# Patient Record
Sex: Female | Born: 1960 | Race: Black or African American | Hispanic: No | Marital: Single | Smoking: Never smoker
Health system: Southern US, Community
[De-identification: ages and names within clinical notes are randomized; demographics above are authoritative.]

## PROBLEM LIST (undated history)

## (undated) DIAGNOSIS — I10 Essential (primary) hypertension: Secondary | ICD-10-CM

## (undated) HISTORY — PX: DILATION AND CURETTAGE OF UTERUS: SHX78

---

## 1998-02-23 ENCOUNTER — Emergency Department (HOSPITAL_COMMUNITY): Admission: EM | Admit: 1998-02-23 | Discharge: 1998-02-23 | Payer: Self-pay | Admitting: Emergency Medicine

## 1998-10-30 ENCOUNTER — Encounter: Payer: Self-pay | Admitting: Orthopedic Surgery

## 1998-10-30 ENCOUNTER — Ambulatory Visit (HOSPITAL_COMMUNITY): Admission: RE | Admit: 1998-10-30 | Discharge: 1998-10-30 | Payer: Self-pay | Admitting: Orthopedic Surgery

## 2001-04-26 ENCOUNTER — Encounter (INDEPENDENT_AMBULATORY_CARE_PROVIDER_SITE_OTHER): Payer: Self-pay | Admitting: Family Medicine

## 2001-04-26 LAB — CONVERTED CEMR LAB: Pap Smear: NORMAL

## 2003-12-19 ENCOUNTER — Emergency Department (HOSPITAL_COMMUNITY): Admission: EM | Admit: 2003-12-19 | Discharge: 2003-12-19 | Payer: Self-pay | Admitting: *Deleted

## 2004-03-09 ENCOUNTER — Ambulatory Visit (HOSPITAL_COMMUNITY): Admission: RE | Admit: 2004-03-09 | Discharge: 2004-03-09 | Payer: Self-pay | Admitting: Family Medicine

## 2004-06-10 ENCOUNTER — Ambulatory Visit (HOSPITAL_COMMUNITY): Admission: RE | Admit: 2004-06-10 | Discharge: 2004-06-10 | Payer: Self-pay | Admitting: Obstetrics & Gynecology

## 2004-06-14 ENCOUNTER — Ambulatory Visit (HOSPITAL_COMMUNITY): Admission: RE | Admit: 2004-06-14 | Discharge: 2004-06-14 | Payer: Self-pay | Admitting: Obstetrics & Gynecology

## 2004-06-14 ENCOUNTER — Encounter (INDEPENDENT_AMBULATORY_CARE_PROVIDER_SITE_OTHER): Payer: Self-pay | Admitting: Specialist

## 2007-07-25 ENCOUNTER — Encounter (INDEPENDENT_AMBULATORY_CARE_PROVIDER_SITE_OTHER): Payer: Self-pay | Admitting: Family Medicine

## 2007-07-25 DIAGNOSIS — J309 Allergic rhinitis, unspecified: Secondary | ICD-10-CM | POA: Insufficient documentation

## 2007-07-25 DIAGNOSIS — Z8739 Personal history of other diseases of the musculoskeletal system and connective tissue: Secondary | ICD-10-CM

## 2007-09-05 ENCOUNTER — Ambulatory Visit: Payer: Self-pay | Admitting: Family Medicine

## 2007-09-05 ENCOUNTER — Other Ambulatory Visit: Admission: RE | Admit: 2007-09-05 | Discharge: 2007-09-05 | Payer: Self-pay | Admitting: Family Medicine

## 2007-09-05 ENCOUNTER — Encounter (INDEPENDENT_AMBULATORY_CARE_PROVIDER_SITE_OTHER): Payer: Self-pay | Admitting: Family Medicine

## 2007-09-05 LAB — CONVERTED CEMR LAB
ALT: 9 units/L (ref 0–35)
AST: 13 units/L (ref 0–37)
Albumin: 4.3 g/dL (ref 3.5–5.2)
Alkaline Phosphatase: 102 units/L (ref 39–117)
Basophils Absolute: 0 10*3/uL (ref 0.0–0.1)
Basophils Relative: 0 % (ref 0–1)
Eosinophils Absolute: 0.1 10*3/uL — ABNORMAL LOW (ref 0.2–0.7)
Eosinophils Relative: 2 % (ref 0–5)
Glucose, Urine, Semiquant: NEGATIVE
HCT: 37.1 % (ref 36.0–46.0)
LDL Cholesterol: 74 mg/dL (ref 0–99)
MCV: 95.9 fL (ref 78.0–100.0)
Neutrophils Relative %: 52 % (ref 43–77)
Nitrite: NEGATIVE
Platelets: 387 10*3/uL (ref 150–400)
Potassium: 3.9 meq/L (ref 3.5–5.3)
RDW: 14.7 % (ref 11.5–15.5)
Sodium: 139 meq/L (ref 135–145)
Specific Gravity, Urine: 1.02
TSH: 0.987 microintl units/mL (ref 0.350–5.50)
Total Bilirubin: 0.4 mg/dL (ref 0.3–1.2)
Total Protein: 7.3 g/dL (ref 6.0–8.3)
Triglycerides: 66 mg/dL (ref <150)
VLDL: 13 mg/dL (ref 0–40)
WBC Urine, dipstick: NEGATIVE
WBC: 5 10*3/uL (ref 4.0–10.5)
pH: 6.5

## 2007-09-18 ENCOUNTER — Encounter (INDEPENDENT_AMBULATORY_CARE_PROVIDER_SITE_OTHER): Payer: Self-pay | Admitting: Family Medicine

## 2008-02-21 ENCOUNTER — Telehealth (INDEPENDENT_AMBULATORY_CARE_PROVIDER_SITE_OTHER): Payer: Self-pay | Admitting: *Deleted

## 2008-02-22 ENCOUNTER — Ambulatory Visit: Payer: Self-pay | Admitting: Internal Medicine

## 2008-02-22 DIAGNOSIS — L259 Unspecified contact dermatitis, unspecified cause: Secondary | ICD-10-CM

## 2008-02-25 ENCOUNTER — Ambulatory Visit: Payer: Self-pay | Admitting: Family Medicine

## 2010-10-16 ENCOUNTER — Encounter: Payer: Self-pay | Admitting: *Deleted

## 2010-10-17 ENCOUNTER — Encounter: Payer: Self-pay | Admitting: Family Medicine

## 2011-02-11 NOTE — Op Note (Signed)
Allison Pugh, MCKINSTRY                           ACCOUNT NO.:  [id number]   MEDICAL RECORD NO.:  [id number]                   PATIENT TYPE:  AMB   LOCATION:  SDC                                  FACILITY:  [facility]   PHYSICIAN:  Gerrit Friends. Aldona Bar, M.D.                DATE OF BIRTH:  1961-08-22   DATE OF PROCEDURE:  06/14/2004  DATE OF DISCHARGE:                                 OPERATIVE REPORT   PREOPERATIVE DIAGNOSIS:  Missed abortion, blood type O+.   POSTOPERATIVE DIAGNOSIS:  Missed abortion, blood type O+.  Pathology  pending.   OPERATION PERFORMED:  Suction dilation and curettage for evacuation of  missed abortion.   SURGEON:  Gerrit Friends. Aldona Bar, M.D.   ANESTHESIA:  Mask plus paracervical block with 1% Xylocaine without  epinephrine.   INDICATIONS FOR PROCEDURE:  This 50 year old gravida 7, para 5, aborta 1 was  seen in the office on September 15 and at that time was supposedly [redacted] weeks  gestation but ultrasound by the PA revealed a 9 week empty sac.  She was  sent to [facility] for confirmation with the same report.  She was  asked to return to the office on June 11, 2004 n.p.o. to be scheduled  as an add on which she did, but for some reason, the procedure was scheduled  to be done this morning.  She comes in now n.p.o. with appropriate labs  having been done for a suction dilation and curettage for evacuation of a  missed abortion.   DESCRIPTION OF PROCEDURE:  The patient was taken to the operating room where  after satisfactory induction of light general anesthesia, she was prepped  and draped having been placed in modified lithotomy position with short  Allen stirrups.  The bladder was drained of clear urine with red rubber  catheter in an in and out fashion.  After the patient was appropriately  draped, the procedure was begun.  A speculum was placed in the vagina and a  single toothed tenaculum placed on the anterior lip of the cervix.  At this  time a paracervical  block was carried out using 20 mL of 1% Xylocaine  without epinephrine.  Thereafter, using Pratt dilators, the internal os was  dilated to #27 without difficulty.  Thereafter using as #9 suction curet,  the cavity was thoroughly, gently and systematically evacuated of all  products of conception.  Confirmation was carried out with a small standard  curet and resuction produced no additional tissue.  At this time the patient  was felt to be complete and was taken to the recovery room in satisfactory  condition having tolerated the procedure well.  The estimated blood loss was  negligible.  All counts were correct times two.   PATHOLOGIC SPECIMENS:  Products of conception.   The patient will be observed in recovery and subsequently discharged to  home.   DISCHARGE  MEDICATIONS:  Advil or Aleve as needed for mild cramping.  Darvocet-N 100 one every four hours as needed for more severe cramping,  Phenergan 25 mg tablets one every four hours as needed for nausea, and  doxycycline 100 mg one twice daily for a total of five days to avoid  infection.   FOLLOW UP:  She will be asked to return to the office in follow-up in  approximately one or two weeks' time.  She will be given an instruction  sheet at time of discharge.   Condition on arrival to recovery satisfactory.      RMW/MEDQ  D:  06/14/2004  T:  06/14/2004  Job:  [id number]

## 2013-11-25 ENCOUNTER — Encounter (HOSPITAL_COMMUNITY): Payer: Self-pay | Admitting: Emergency Medicine

## 2013-11-25 ENCOUNTER — Emergency Department (HOSPITAL_COMMUNITY): Payer: Self-pay

## 2013-11-25 ENCOUNTER — Emergency Department (HOSPITAL_COMMUNITY)
Admission: EM | Admit: 2013-11-25 | Discharge: 2013-11-25 | Disposition: A | Discharge status: Home / Self Care | Payer: Self-pay | Attending: Emergency Medicine | Admitting: Emergency Medicine

## 2013-11-25 DIAGNOSIS — R0789 Other chest pain: Secondary | ICD-10-CM

## 2013-11-25 DIAGNOSIS — R079 Chest pain, unspecified: Secondary | ICD-10-CM

## 2013-11-25 DIAGNOSIS — R42 Dizziness and giddiness: Secondary | ICD-10-CM | POA: Insufficient documentation

## 2013-11-25 DIAGNOSIS — R51 Headache: Secondary | ICD-10-CM | POA: Insufficient documentation

## 2013-11-25 DIAGNOSIS — R071 Chest pain on breathing: Secondary | ICD-10-CM | POA: Insufficient documentation

## 2013-11-25 DIAGNOSIS — E669 Obesity, unspecified: Secondary | ICD-10-CM | POA: Insufficient documentation

## 2013-11-25 DIAGNOSIS — R0602 Shortness of breath: Secondary | ICD-10-CM | POA: Insufficient documentation

## 2013-11-25 DIAGNOSIS — R000 Tachycardia, unspecified: Secondary | ICD-10-CM | POA: Insufficient documentation

## 2013-11-25 LAB — I-STAT TROPONIN, ED
Troponin i, poc: 0 ng/mL (ref 0.00–0.08)
Troponin i, poc: 0 ng/mL (ref 0.00–0.08)

## 2013-11-25 LAB — CBC
HCT: 36.1 % (ref 36.0–46.0)
Hemoglobin: 12 g/dL (ref 12.0–15.0)
MCH: 30.6 pg (ref 26.0–34.0)
MCHC: 33.2 g/dL (ref 30.0–36.0)
MCV: 92.1 fL (ref 78.0–100.0)
PLATELETS: 319 10*3/uL (ref 150–400)
RBC: 3.92 MIL/uL (ref 3.87–5.11)
RDW: 14.1 % (ref 11.5–15.5)
WBC: 5.1 10*3/uL (ref 4.0–10.5)

## 2013-11-25 LAB — BASIC METABOLIC PANEL
BUN: 12 mg/dL (ref 6–23)
CHLORIDE: 103 meq/L (ref 96–112)
CO2: 26 meq/L (ref 19–32)
Calcium: 9.7 mg/dL (ref 8.4–10.5)
Creatinine, Ser: 0.67 mg/dL (ref 0.50–1.10)
GFR calc Af Amer: >90 mL/min (ref >90)
GFR calc non Af Amer: >90 mL/min (ref >90)
Glucose, Bld: 115 mg/dL — ABNORMAL HIGH (ref 70–99)
POTASSIUM: 3.9 meq/L (ref 3.7–5.3)
Sodium: 141 mEq/L (ref 137–147)

## 2013-11-25 LAB — D-DIMER, QUANTITATIVE (NOT AT ARMC)

## 2013-11-25 MED ORDER — KETOROLAC TROMETHAMINE 15 MG/ML IJ SOLN
30.0000 mg | Freq: Once | INTRAMUSCULAR | Status: AC
  Administered 2013-11-25: 30 mg via INTRAVENOUS
  Filled 2013-11-25: qty 2

## 2013-11-25 NOTE — ED Provider Notes (Signed)
CSN: [id number]     Arrival date & time 11/25/13  4098 History   First MD Initiated Contact with Patient 11/25/13 1007     Chief Complaint  Patient presents with  . Chest Pain  . Headache  . Dizziness     Patient is a 53 y.o. female presenting with chest pain.  Chest Pain Pain location:  Substernal area and R chest Pain quality: aching   Pain radiates to:  Does not radiate Pain radiates to the back: no   Pain severity:  Mild Onset quality:  Gradual Duration:  1 month Timing:  Constant Progression:  Worsening Chronicity:  New Context: breathing   Relieved by:  None tried Worsened by:  Movement, deep breathing and certain positions Ineffective treatments:  None tried Associated symptoms: shortness of breath   Associated symptoms: no abdominal pain, no AICD problem, no altered mental status, no anorexia, no back pain, no claudication, no cough, no diaphoresis, no dizziness, no dysphagia, no fatigue, no fever, no headache, no heartburn, no lower extremity edema, no nausea, no near-syncope, no numbness, no orthopnea, no palpitations, no PND, no syncope, not vomiting and no weakness   Risk factors: obesity   Risk factors: no birth control, no coronary artery disease, no diabetes mellitus, no high cholesterol, no hypertension, not pregnant, no prior DVT/PE, no smoking and no surgery     History reviewed. No pertinent past medical history. Past Surgical History  Procedure Laterality Date  . Dilation and curettage of uterus     No family history on file. History  Substance Use Topics  . Smoking status: Never Smoker   . Smokeless tobacco: Not on file  . Alcohol Use: No   OB History   Grav Para Term Preterm Abortions TAB SAB Ect Mult Living                 Review of Systems  Constitutional: Negative for fever, chills, diaphoresis, activity change, appetite change and fatigue.  HENT: Negative for congestion, ear pain, rhinorrhea and trouble swallowing.   Eyes: Negative for  pain.  Respiratory: Positive for shortness of breath. Negative for cough.   Cardiovascular: Positive for chest pain. Negative for palpitations, orthopnea, claudication, syncope, PND and near-syncope.  Gastrointestinal: Negative for heartburn, nausea, vomiting, abdominal pain and anorexia.  Genitourinary: Negative for dysuria, difficulty urinating and pelvic pain.  Musculoskeletal: Negative for back pain and neck pain.  Skin: Negative for rash and wound.  Neurological: Negative for dizziness, weakness, numbness and headaches.  Psychiatric/Behavioral: Negative for behavioral problems, confusion and agitation.      Allergies  Review of patient's allergies indicates no known allergies.  Home Medications   Current Outpatient Rx  Name  Route  Sig  Dispense  Refill  . acetaminophen (TYLENOL) 500 MG tablet   Oral   Take 500 mg by mouth every 6 (six) hours as needed for mild pain.         Marland Kitchen ibuprofen (ADVIL,MOTRIN) 200 MG tablet   Oral   Take 600 mg by mouth every 6 (six) hours as needed for moderate pain.         . Tetrahydrozoline HCl (EYE DROPS OP)   Both Eyes   Place 2 drops into both eyes daily as needed (dry eyes).          BP 131/65  Pulse 88  Temp(Src) 98.5 F (36.9 C) (Oral)  Resp 17  Ht 5\' 5"  (1.651 m)  Wt 216 lb (97.977 kg)  BMI 35.94 kg/m2  SpO2 99%  LMP 11/25/2013 Physical Exam  Constitutional: She is oriented to person, place, and time. She appears well-developed and well-nourished. No distress.  HENT:  Head: Normocephalic and atraumatic.  Nose: Nose normal.  Mouth/Throat: Oropharynx is clear and moist.  Eyes: EOM are normal. Pupils are equal, round, and reactive to light.  Neck: Normal range of motion. Neck supple. No tracheal deviation present.  Cardiovascular: Regular rhythm, normal heart sounds and intact distal pulses.   Tachycardia   Pulmonary/Chest: Effort normal and breath sounds normal. She has no rales. She exhibits tenderness.  Abdominal:  Soft. Bowel sounds are normal. She exhibits no distension. There is no tenderness. There is no rebound and no guarding.  Musculoskeletal: Normal range of motion. She exhibits no tenderness.  Neurological: She is alert and oriented to person, place, and time.  Skin: Skin is warm and dry. No rash noted.  Psychiatric: She has a normal mood and affect. Her behavior is normal.    ED Course  Procedures (including critical care time) Labs Review Results for orders placed during the hospital encounter of 11/25/13  CBC      Result Value Ref Range   WBC 5.1  4.0 - 10.5 K/uL   RBC 3.92  3.87 - 5.11 MIL/uL   Hemoglobin 12.0  12.0 - 15.0 g/dL   HCT 40.936.1  81.136.0 - 91.446.0 %   MCV 92.1  78.0 - 100.0 fL   MCH 30.6  26.0 - 34.0 pg   MCHC 33.2  30.0 - 36.0 g/dL   RDW 78.214.1  95.611.5 - 21.315.5 %   Platelets 319  150 - 400 K/uL  BASIC METABOLIC PANEL      Result Value Ref Range   Sodium 141  137 - 147 mEq/L   Potassium 3.9  3.7 - 5.3 mEq/L   Chloride 103  96 - 112 mEq/L   CO2 26  19 - 32 mEq/L   Glucose, Bld 115 (*) 70 - 99 mg/dL   BUN 12  6 - 23 mg/dL   Creatinine, Ser 0.860.67  0.50 - 1.10 mg/dL   Calcium 9.7  8.4 - 57.810.5 mg/dL   GFR calc non Af Amer >90  >90 mL/min   GFR calc Af Amer >90  >90 mL/min  D-DIMER, QUANTITATIVE      Result Value Ref Range   D-Dimer, Quant <0.27  0.00 - 0.48 ug/mL-FEU  I-STAT TROPOININ, ED      Result Value Ref Range   Troponin i, poc 0.00  0.00 - 0.08 ng/mL   Comment 3           I-STAT TROPOININ, ED      Result Value Ref Range   Troponin i, poc 0.00  0.00 - 0.08 ng/mL   Comment 3             Imaging Review Dg Chest Port 1 View  11/25/2013   CLINICAL DATA:  Chest pain  EXAM: PORTABLE CHEST - 1 VIEW  COMPARISON:  None.  FINDINGS: Lungs are clear. Heart size and pulmonary vascularity are normal. No adenopathy. No pneumothorax. No bone lesions.  IMPRESSION: No abnormality noted.   Electronically Signed   By: Bretta BangWilliam  Woodruff M.D.   On: 11/25/2013 10:19     EKG  Interpretation   Date/Time:  Monday November 25 2013 09:57:29 EST Ventricular Rate:  98 PR Interval:  114 QRS Duration: 82 QT Interval:  338 QTC Calculation: 431 R Axis:   79 Text Interpretation:  Normal sinus rhythm  Nonspecific T wave abnormality  Abnormal ECG No previous tracing Confirmed by BEATON  MD, ROBERT ([id number])  on 11/25/2013 10:15:21 AM      MDM   Final diagnoses:  Chest pain  Chest wall pain    53 yo F in NAD AFVSS non toxic appearing who presents with 1 month hx of substernal and R sided chest discomfort which worsens when supine and with palpation. No oral contraceptive use, no recent travel, family hx of DVT or PE, no unilateral leg swelling. Will obtain Dimer as patient was tachycardic on arrival. CXR with NACPD. EKG with S in lead 1, Q in lead III, and inverted T wave in lead III. Patient is not hypoxic. Chest wall is TTP.   1:44 PM HEART SCORE low risk. Dimer neg. Doubt PE doubt ACS. Troponin x 2 wnl.   CP improved with Toradol. Suspect chest wall pain.   Will send home with rx for ibuprofen.  Thorough discussion with patient on plan, findings, return precautions. Case co managed with my attending Dr. Radford Pax.   Nadara Mustard, MD 11/25/13 1346

## 2013-11-25 NOTE — ED Provider Notes (Signed)
I saw and evaluated the patient, reviewed the resident's note and I agree with the findings and plan.   .Face to face Exam:  General:  Awake HEENT:  Atraumatic Resp:  Normal effort Abd:  Nondistended Neuro:No focal weakness    [facility] [place] [facility] Monike Bragdon, MD 11/25/13 1347 

## 2013-11-25 NOTE — ED Notes (Signed)
Patient reports she has had chest pain, headaches, and dizziness for 1 mth.  She has not seen a doctor in years.  Patient states her sx are increasing thus her visit today.  Patient has not taken any meds for pain today

## 2013-11-25 NOTE — Discharge Planning (Signed)
[facility] Felicia E, [employer]  Spoke to patient about primary care resources, resource guide and orange card application. Patient also was given my contact information for any future questions or concerns.

## 2013-11-25 NOTE — Discharge Instructions (Signed)
Chest Pain (Nonspecific) °It is often hard to give a specific diagnosis for the cause of chest pain. There is always a chance that your pain could be related to something serious, such as a heart attack or a blood clot in the lungs. You need to follow up with your caregiver for further evaluation. °CAUSES  °· Heartburn. °· Pneumonia or bronchitis. °· Anxiety or stress. °· Inflammation around your heart (pericarditis) or lung (pleuritis or pleurisy). °· A blood clot in the lung. °· A collapsed lung (pneumothorax). It can develop suddenly on its own (spontaneous pneumothorax) or from injury (trauma) to the chest. °· Shingles infection (herpes zoster virus). °The chest wall is composed of bones, muscles, and cartilage. Any of these can be the source of the pain. °· The bones can be bruised by injury. °· The muscles or cartilage can be strained by coughing or overwork. °· The cartilage can be affected by inflammation and become sore (costochondritis). °DIAGNOSIS  °Lab tests or other studies, such as X-rays, electrocardiography, stress testing, or cardiac imaging, may be needed to find the cause of your pain.  °TREATMENT  °· Treatment depends on what may be causing your chest pain. Treatment may include: °· Acid blockers for heartburn. °· Anti-inflammatory medicine. °· Pain medicine for inflammatory conditions. °· Antibiotics if an infection is present. °· You may be advised to change lifestyle habits. This includes stopping smoking and avoiding alcohol, caffeine, and chocolate. °· You may be advised to keep your head raised (elevated) when sleeping. This reduces the chance of acid going backward from your stomach into your esophagus. °· Most of the time, nonspecific chest pain will improve within 2 to 3 days with rest and mild pain medicine. °HOME CARE INSTRUCTIONS  °· If antibiotics were prescribed, take your antibiotics as directed. Finish them even if you start to feel better. °· For the next few days, avoid physical  activities that bring on chest pain. Continue physical activities as directed. °· Do not smoke. °· Avoid drinking alcohol. °· Only take over-the-counter or prescription medicine for pain, discomfort, or fever as directed by your caregiver. °· Follow your caregiver's suggestions for further testing if your chest pain does not go away. °· Keep any follow-up appointments you made. If you do not go to an appointment, you could develop lasting (chronic) problems with pain. If there is any problem keeping an appointment, you must call to reschedule. °SEEK MEDICAL CARE IF:  °· You think you are having problems from the medicine you are taking. Read your medicine instructions carefully. °· Your chest pain does not go away, even after treatment. °· You develop a rash with blisters on your chest. °SEEK IMMEDIATE MEDICAL CARE IF:  °· You have increased chest pain or pain that spreads to your arm, neck, jaw, back, or abdomen. °· You develop shortness of breath, an increasing cough, or you are coughing up blood. °· You have severe back or abdominal pain, feel nauseous, or vomit. °· You develop severe weakness, fainting, or chills. °· You have a fever. °THIS IS AN EMERGENCY. Do not wait to see if the pain will go away. Get medical help at once. Call your local emergency services (911 in [place] Do not drive yourself to the hospital. °MAKE SURE YOU:  °· Understand these instructions. °· Will watch your condition. °· Will get help right away if you are not doing well or get worse. °Document Released: 06/22/2005 Document Revised: 12/05/2011 Document Reviewed: 04/17/2008 °ExitCare® Patient Information ©2014 [employer]   [employer]. ° °

## 2013-11-25 NOTE — ED Notes (Signed)
Nurse said he was starting IV and would get the blood

## 2016-11-29 ENCOUNTER — Emergency Department (HOSPITAL_COMMUNITY)
Admission: EM | Admit: 2016-11-29 | Discharge: 2016-11-29 | Disposition: A | Discharge status: Home / Self Care | Payer: Self-pay | Attending: Emergency Medicine | Admitting: Emergency Medicine

## 2016-11-29 ENCOUNTER — Encounter (HOSPITAL_COMMUNITY): Payer: Self-pay

## 2016-11-29 DIAGNOSIS — S61052A Open bite of left thumb without damage to nail, initial encounter: Secondary | ICD-10-CM | POA: Insufficient documentation

## 2016-11-29 DIAGNOSIS — K0889 Other specified disorders of teeth and supporting structures: Secondary | ICD-10-CM | POA: Insufficient documentation

## 2016-11-29 DIAGNOSIS — Y999 Unspecified external cause status: Secondary | ICD-10-CM | POA: Insufficient documentation

## 2016-11-29 DIAGNOSIS — N644 Mastodynia: Secondary | ICD-10-CM | POA: Insufficient documentation

## 2016-11-29 DIAGNOSIS — W503XXA Accidental bite by another person, initial encounter: Secondary | ICD-10-CM

## 2016-11-29 DIAGNOSIS — Y9389 Activity, other specified: Secondary | ICD-10-CM | POA: Insufficient documentation

## 2016-11-29 DIAGNOSIS — Y929 Unspecified place or not applicable: Secondary | ICD-10-CM | POA: Insufficient documentation

## 2016-11-29 MED ORDER — IBUPROFEN 400 MG PO TABS
600.0000 mg | ORAL_TABLET | Freq: Once | ORAL | Status: AC
  Administered 2016-11-29: 600 mg via ORAL
  Filled 2016-11-29: qty 1

## 2016-11-29 MED ORDER — ACETAMINOPHEN 500 MG PO TABS
1000.0000 mg | ORAL_TABLET | Freq: Once | ORAL | Status: AC
  Administered 2016-11-29: 1000 mg via ORAL
  Filled 2016-11-29: qty 2

## 2016-11-29 NOTE — ED Notes (Signed)
See EDP assessment 

## 2016-11-29 NOTE — Discharge Instructions (Signed)
As we discussed the lump on your left breast did not look like an abscess on ultrasound.  Given the fact that it does not look infected and it is getting better and smaller, we will not prescribe you antibiotics.  Please monitor and make sure it continues to improve.  Return to the emergency department if you notice increase swelling, redness, warmth or discharge   Take ibuprofen or tylenol for associated pain with your cracked tooth.  Please call dentist as soon as possible for an evaluation and for treatment/repair of your cracked tooth
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
## 2017-01-17 ENCOUNTER — Emergency Department (HOSPITAL_COMMUNITY)
Admission: EM | Admit: 2017-01-17 | Discharge: 2017-01-17 | Disposition: A | Discharge status: Home / Self Care | Payer: Self-pay | Attending: Emergency Medicine | Admitting: Emergency Medicine

## 2017-01-17 ENCOUNTER — Encounter (HOSPITAL_COMMUNITY): Payer: Self-pay | Admitting: Emergency Medicine

## 2017-01-17 DIAGNOSIS — L02416 Cutaneous abscess of left lower limb: Secondary | ICD-10-CM | POA: Insufficient documentation

## 2017-01-17 DIAGNOSIS — Z23 Encounter for immunization: Secondary | ICD-10-CM | POA: Insufficient documentation

## 2017-01-17 DIAGNOSIS — Z79899 Other long term (current) drug therapy: Secondary | ICD-10-CM | POA: Insufficient documentation

## 2017-01-17 DIAGNOSIS — L0291 Cutaneous abscess, unspecified: Secondary | ICD-10-CM

## 2017-01-17 MED ORDER — SULFAMETHOXAZOLE-TRIMETHOPRIM 800-160 MG PO TABS
1.0000 | ORAL_TABLET | Freq: Once | ORAL | Status: AC
  Administered 2017-01-17: 1 via ORAL
  Filled 2017-01-17: qty 1

## 2017-01-17 MED ORDER — TETANUS-DIPHTH-ACELL PERTUSSIS 5-2.5-18.5 LF-MCG/0.5 IM SUSP
0.5000 mL | Freq: Once | INTRAMUSCULAR | Status: AC
  Administered 2017-01-17: 0.5 mL via INTRAMUSCULAR
  Filled 2017-01-17: qty 0.5

## 2017-01-17 MED ORDER — SULFAMETHOXAZOLE-TRIMETHOPRIM 800-160 MG PO TABS: 1.0000 | ORAL_TABLET | Freq: Two times a day (BID) | ORAL | 0 refills | Status: AC

## 2017-01-17 NOTE — ED Provider Notes (Signed)
[facility]-EMERGENCY DEPT Provider Note   CSN: [id number] Arrival date & time: 01/17/17  2040   By signing my name below, I, Teofilo Pod, attest that this documentation has been prepared under the direction and in the presence of Kerrie Buffalo, NP. Electronically Signed: Teofilo Pod, ED Scribe. 01/17/2017. 9:31 PM.   History   Chief Complaint Chief Complaint  Patient presents with  . Abscess    The history is provided by the patient. No language interpreter was used.  Abscess  Location:  Leg Leg abscess location:  L knee Abscess quality: draining and fluctuance   Red streaking: no   Duration:  2 months Progression:  Worsening Context: not diabetes   Relieved by:  Nothing Worsened by:  Nothing Ineffective treatments:  None tried Associated symptoms: no fever, no headaches, no nausea and no vomiting    HPI Comments: Allison Pugh is a 56 y.o. female who presents to the Emergency Department complaining of a moderate, gradually worsening area of pain and swelling to the left knee x 2 monts. Pt states pain is exacerbated with palpation and direct pressure. Pt reports mild drainage today. Patient reports that initially she thought she had a bug bite and she scratched the area. Pt states that she is otherwise healthy and denies any previous similar abscesses. No alleviating factors noted. Pt denies fever.    History reviewed. No pertinent past medical history.  Patient Active Problem List   Diagnosis Date Noted  . DERMATITIS, ALLERGIC 02/22/2008  . ALLERGIC RHINITIS 07/25/2007  . MYALGIA, HX OF 07/25/2007    Past Surgical History:  Procedure Laterality Date  . DILATION AND CURETTAGE OF UTERUS      OB History    No data available       Home Medications    Prior to Admission medications   Medication Sig Start Date End Date Taking? Authorizing Provider  acetaminophen (TYLENOL) 500 MG tablet Take 500 mg by mouth every 6 (six) hours as needed for mild pain.     Historical Provider, MD  ibuprofen (ADVIL,MOTRIN) 200 MG tablet Take 600 mg by mouth every 6 (six) hours as needed for moderate pain.    Historical Provider, MD  sulfamethoxazole-trimethoprim (BACTRIM DS,SEPTRA DS) 800-160 MG tablet Take 1 tablet by mouth 2 (two) times daily. 01/17/17 01/24/17  Berl Bonfanti Orlene Och, NP  Tetrahydrozoline HCl (EYE DROPS OP) Place 2 drops into both eyes daily as needed (dry eyes).    Historical Provider, MD    Family History No family history on file.  Social History Social History  Substance Use Topics  . Smoking status: Never Smoker  . Smokeless tobacco: Never Used  . Alcohol use No     Allergies   Patient has no known allergies.   Review of Systems Review of Systems  Constitutional: Negative for chills and fever.  HENT: Negative for congestion.   Respiratory: Negative for cough.   Gastrointestinal: Negative for abdominal pain, nausea and vomiting.  Musculoskeletal: Positive for arthralgias. Negative for joint swelling.       +area of pain and swelling  Skin: Positive for wound.  Neurological: Negative for headaches.  Psychiatric/Behavioral: The patient is not nervous/anxious.      Physical Exam Updated Vital Signs BP 137/77 (BP Location: Right Arm)   Pulse 89   Temp 98.8 F (37.1 C) (Oral)   Resp 16   Ht  (1.626 m)   Wt 98 kg   LMP 11/25/2013   SpO2 100%  BMI 37.08 kg/m   Physical Exam  Constitutional: She appears well-developed and well-nourished. No distress.  HENT:  Head: Normocephalic and atraumatic.  Eyes: Conjunctivae are normal.  Cardiovascular: Normal rate.   Pulmonary/Chest: Effort normal.  Abdominal: She exhibits no distension.  Musculoskeletal:       Legs: 2 cm area with dark center just below the anterior left knee. Area is tender on exam. No red streaking, there is mild erythema surrounding the area.   Neurological: She is alert.  Skin: Skin is warm and dry.  2cm raised fluctuant area to the left lower leg just  below the knee. Area has a dark center with surrounding erythema, no red streaking.   Psychiatric: She has a normal mood and affect. Her behavior is normal.  Nursing note and vitals reviewed.    ED Treatments / Results  DIAGNOSTIC STUDIES:  Oxygen Saturation is 100% on RA, normal by my interpretation.    COORDINATION OF CARE:  9:29 PM Will drain abscess. Discussed treatment plan with pt at bedside and pt agreed to plan.   Labs (all labs ordered are listed, but only abnormal results are displayed) Labs Reviewed - No data to display   Radiology No results found.  Procedures: Wound cleaned with Betadine, using a 19 gage needle the wound was deroofed. Small amount of purulent drainage expressed from the wound.  Procedures (including critical care time)  Medications Ordered in ED Medications  sulfamethoxazole-trimethoprim (BACTRIM DS,SEPTRA DS) 800-160 MG per tablet 1 tablet (1 tablet Oral Given 01/17/17 2149)  Tdap (BOOSTRIX) injection 0.5 mL (0.5 mLs Intramuscular Given 01/17/17 2155)     Initial Impression / Assessment and Plan / ED Course  I have reviewed the triage vital signs and the nursing notes.  Pertinent labs & imaging results that were available during my care of the patient were reviewed by me and considered in my medical decision making (see chart for details).  Patient with skin abscess. Incision and drainage performed in the ED today.  Abscess was not large enough to warrant packing or drain placement. Wound recheck in 2 days. Supportive care and return precautions discussed.  Pt sent home with Bactrim. The patient appears reasonably screened and/or stabilized for discharge and I doubt any other emergent medical condition requiring further screening, evaluation, or treatment in the ED prior to discharge.   Final Clinical Impressions(s) / ED Diagnoses   Final diagnoses:  Abscess    New Prescriptions Discharge Medication List as of 01/17/2017  9:41 PM    START  taking these medications   Details  sulfamethoxazole-trimethoprim (BACTRIM DS,SEPTRA DS) 800-160 MG tablet Take 1 tablet by mouth 2 (two) times daily., Starting Tue 01/17/2017, Until Tue 01/24/2017, Print      I personally performed the services described in this documentation, which was scribed in my presence. The recorded information has been reviewed and is accurate.    [address] [place], [place] 01/18/17 4540    Loren Racer, MD 01/18/17 [phone]

## 2017-01-17 NOTE — Discharge Instructions (Signed)
Take the medication as directed. Take tylenol or ibuprofen as needed for pain. Apply warm wet compresses to the area several times a day for the next few days. Return as needed for worsening symptoms.

## 2017-01-17 NOTE — ED Triage Notes (Signed)
Pt. reports small skin abscess with redness and mild pain at left knee onset 2 months ago , no drainage .

## 2017-02-09 ENCOUNTER — Other Ambulatory Visit (HOSPITAL_COMMUNITY)
Admission: RE | Admit: 2017-02-09 | Discharge: 2017-02-09 | Disposition: A | Discharge status: Home / Self Care | Payer: Self-pay | Source: Ambulatory Visit | Attending: Physician Assistant | Admitting: Physician Assistant

## 2017-02-09 ENCOUNTER — Encounter (INDEPENDENT_AMBULATORY_CARE_PROVIDER_SITE_OTHER): Payer: Self-pay | Admitting: Physician Assistant

## 2017-02-09 ENCOUNTER — Ambulatory Visit (INDEPENDENT_AMBULATORY_CARE_PROVIDER_SITE_OTHER): Payer: Self-pay | Admitting: Physician Assistant

## 2017-02-09 VITALS — BP 145/86 | HR 71 | Temp 97.6°F | Ht 62.0 in | Wt 211.8 lb

## 2017-02-09 DIAGNOSIS — I10 Essential (primary) hypertension: Secondary | ICD-10-CM

## 2017-02-09 DIAGNOSIS — Z124 Encounter for screening for malignant neoplasm of cervix: Secondary | ICD-10-CM | POA: Insufficient documentation

## 2017-02-09 DIAGNOSIS — Z0000 Encounter for general adult medical examination without abnormal findings: Secondary | ICD-10-CM

## 2017-02-09 DIAGNOSIS — Z1211 Encounter for screening for malignant neoplasm of colon: Secondary | ICD-10-CM

## 2017-02-09 DIAGNOSIS — Z1231 Encounter for screening mammogram for malignant neoplasm of breast: Secondary | ICD-10-CM

## 2017-02-09 DIAGNOSIS — Z1159 Encounter for screening for other viral diseases: Secondary | ICD-10-CM

## 2017-02-09 MED ORDER — HYDROCHLOROTHIAZIDE 12.5 MG PO TABS: 12.5000 mg | ORAL_TABLET | Freq: Every day | ORAL | 1 refills | Status: DC

## 2017-02-09 NOTE — Patient Instructions (Addendum)
Pap Test Why am I having this test? A pap test is sometimes called a pap smear. It is a screening test that is used to check for signs of cancer of the vagina, cervix, and uterus. The test can also identify the presence of infection or precancerous changes. Your health care provider will likely recommend you have this test done on a regular basis. This test may be done:  Every 3 years, starting at age 56.  Every 5 years, in combination with testing for the presence of human papillomavirus (HPV).  More or less often depending on other medical conditions. What kind of sample is taken? Using a small cotton swab, plastic spatula, or brush, your health care provider will collect a sample of cells from the surface of your cervix. Your cervix is the opening to your uterus, also called a womb. Secretions from the cervix and vagina may also be collected. How do I prepare for this test?  Be aware of where you are in your menstrual cycle. You may be asked to reschedule the test if you are menstruating on the day of the test.  You may need to reschedule if you have a known vaginal infection on the day of the test.  You may be asked to avoid douching or taking a bath the day before or the day of the test.  Some medicines can cause abnormal test results, such as digitalis and tetracycline. Talk with your health care provider before your test if you take one of these medicines. What do the results mean? Abnormal test results may indicate a number of health conditions. These may include:  Cancer. Although pap test results cannot be used to diagnose cancer of the cervix, vagina, or uterus, they may suggest the possibility of cancer. Further tests would be required to determine if cancer is present.  Sexually transmitted disease.  Fungal infection.  Parasite infection.  Herpes infection.  A condition causing or contributing to infertility. It is your responsibility to obtain your test results. Ask  the lab or department performing the test when and how you will get your results. Contact your health care provider to discuss any questions you have about your results. Talk with your health care provider to discuss your results, treatment options, and if necessary, the need for more tests. Talk with your health care provider if you have any questions about your results. This information is not intended to replace advice given to you by your health care provider. Make sure you discuss any questions you have with your health care provider. Document Released: 12/03/2002 Document Revised: 05/18/2016 Document Reviewed: 02/03/2014 Elsevier Interactive Patient Education  2017 [employer]  Hypertension Hypertension, commonly called high blood pressure, is when the force of blood pumping through the arteries is too strong. The arteries are the blood vessels that carry blood from the heart throughout the body. Hypertension forces the heart to work harder to pump blood and may cause arteries to become narrow or stiff. Having untreated or uncontrolled hypertension can cause heart attacks, strokes, kidney disease, and other problems. A blood pressure reading consists of a higher number over a lower number. Ideally, your blood pressure should be below 120/80. The first ("top") number is called the systolic pressure. It is a measure of the pressure in your arteries as your heart beats. The second ("bottom") number is called the diastolic pressure. It is a measure of the pressure in your arteries as the heart relaxes. What are the causes? The cause of  this condition is not known. What increases the risk? Some risk factors for high blood pressure are under your control. Others are not. Factors you can change   Smoking.  Having type 2 diabetes mellitus, high cholesterol, or both.  Not getting enough exercise or physical activity.  Being overweight.  Having too much fat, sugar, calories, or salt (sodium) in  your diet.  Drinking too much alcohol. Factors that are difficult or impossible to change   Having chronic kidney disease.  Having a family history of high blood pressure.  Age. Risk increases with age.  Race. You may be at higher risk if you are African-American.  Gender. Men are at higher risk than women before age 56. After age 56, women are at higher risk than men.  Having obstructive sleep apnea.  Stress. What are the signs or symptoms? Extremely high blood pressure (hypertensive crisis) may cause:  Headache.  Anxiety.  Shortness of breath.  Nosebleed.  Nausea and vomiting.  Severe chest pain.  Jerky movements you cannot control (seizures). How is this diagnosed? This condition is diagnosed by measuring your blood pressure while you are seated, with your arm resting on a surface. The cuff of the blood pressure monitor will be placed directly against the skin of your upper arm at the level of your heart. It should be measured at least twice using the same arm. Certain conditions can cause a difference in blood pressure between your right and left arms. Certain factors can cause blood pressure readings to be lower or higher than normal (elevated) for a short period of time:  When your blood pressure is higher when you are in a health care provider's office than when you are at home, this is called white coat hypertension. Most people with this condition do not need medicines.  When your blood pressure is higher at home than when you are in a health care provider's office, this is called masked hypertension. Most people with this condition may need medicines to control blood pressure. If you have a high blood pressure reading during one visit or you have normal blood pressure with other risk factors:  You may be asked to return on a different day to have your blood pressure checked again.  You may be asked to monitor your blood pressure at home for 1 week or longer. If  you are diagnosed with hypertension, you may have other blood or imaging tests to help your health care provider understand your overall risk for other conditions. How is this treated? This condition is treated by making healthy lifestyle changes, such as eating healthy foods, exercising more, and reducing your alcohol intake. Your health care provider may prescribe medicine if lifestyle changes are not enough to get your blood pressure under control, and if:  Your systolic blood pressure is above 130.  Your diastolic blood pressure is above 80. Your personal target blood pressure may vary depending on your medical conditions, your age, and other factors. Follow these instructions at home: Eating and drinking   Eat a diet that is high in fiber and potassium, and low in sodium, added sugar, and fat. An example eating plan is called the DASH (Dietary Approaches to Stop Hypertension) diet. To eat this way:  Eat plenty of fresh fruits and vegetables. Try to fill half of your plate at each meal with fruits and vegetables.  Eat whole grains, such as whole wheat pasta, brown rice, or whole grain bread. Fill about one quarter of your  plate with whole grains.  Eat or drink low-fat dairy products, such as skim milk or low-fat yogurt.  Avoid fatty cuts of meat, processed or cured meats, and poultry with skin. Fill about one quarter of your plate with lean proteins, such as fish, chicken without skin, beans, eggs, and tofu.  Avoid premade and processed foods. These tend to be higher in sodium, added sugar, and fat.  Reduce your daily sodium intake. Most people with hypertension should eat less than 1,500 mg of sodium a day.  Limit alcohol intake to no more than 1 drink a day for nonpregnant women and 2 drinks a day for men. One drink equals 12 oz of beer, 5 oz of wine, or 1 oz of hard liquor. Lifestyle   Work with your health care provider to maintain a healthy body weight or to lose weight. Ask  what an ideal weight is for you.  Get at least 30 minutes of exercise that causes your heart to beat faster (aerobic exercise) most days of the week. Activities may include walking, swimming, or biking.  Include exercise to strengthen your muscles (resistance exercise), such as pilates or lifting weights, as part of your weekly exercise routine. Try to do these types of exercises for 30 minutes at least 3 days a week.  Do not use any products that contain nicotine or tobacco, such as cigarettes and e-cigarettes. If you need help quitting, ask your health care provider.  Monitor your blood pressure at home as told by your health care provider.  Keep all follow-up visits as told by your health care provider. This is important. Medicines   Take over-the-counter and prescription medicines only as told by your health care provider. Follow directions carefully. Blood pressure medicines must be taken as prescribed.  Do not skip doses of blood pressure medicine. Doing this puts you at risk for problems and can make the medicine less effective.  Ask your health care provider about side effects or reactions to medicines that you should watch for. Contact a health care provider if:  You think you are having a reaction to a medicine you are taking.  You have headaches that keep coming back (recurring).  You feel dizzy.  You have swelling in your ankles.  You have trouble with your vision. Get help right away if:  You develop a severe headache or confusion.  You have unusual weakness or numbness.  You feel faint.  You have severe pain in your chest or abdomen.  You vomit repeatedly.  You have trouble breathing. Summary  Hypertension is when the force of blood pumping through your arteries is too strong. If this condition is not controlled, it may put you at risk for serious complications.  Your personal target blood pressure may vary depending on your medical conditions, your age,  and other factors. For most people, a normal blood pressure is less than 120/80.  Hypertension is treated with lifestyle changes, medicines, or a combination of both. Lifestyle changes include weight loss, eating a healthy, low-sodium diet, exercising more, and limiting alcohol. This information is not intended to replace advice given to you by your health care provider. Make sure you discuss any questions you have with your health care provider. Document Released: 09/12/2005 Document Revised: 08/10/2016 Document Reviewed: 08/10/2016 Elsevier Interactive Patient Education  2017 [employer].

## 2017-02-09 NOTE — Progress Notes (Signed)
Subjective:  Patient ID: Allison Pugh, female    DOB: April 13, 1961  Age: 56 y.o. MRN: [id number]  CC: Annual physical  HPI Allison PuffCheryl J Mcnutt is a 56 y.o. female with no significant PMH presents for an annual physical. Says she is feeling well overall. Has not had a physical to include a PAP smear in many years. Does not endorse any symptoms/complaints.    Outpatient Medications Prior to Visit  Medication Sig Dispense Refill  . acetaminophen (TYLENOL) 500 MG tablet Take 500 mg by mouth every 6 (six) hours as needed for mild pain.    Marland Kitchen. ibuprofen (ADVIL,MOTRIN) 200 MG tablet Take 600 mg by mouth every 6 (six) hours as needed for moderate pain.    . Tetrahydrozoline HCl (EYE DROPS OP) Place 2 drops into both eyes daily as needed (dry eyes).     No facility-administered medications prior to visit.      ROS Review of Systems  Constitutional: Negative for chills, fever and malaise/fatigue.  Eyes: Negative for blurred vision.  Respiratory: Negative for shortness of breath.   Cardiovascular: Negative for chest pain and palpitations.  Gastrointestinal: Negative for abdominal pain and nausea.  Genitourinary: Negative for dysuria and hematuria.  Musculoskeletal: Negative for joint pain and myalgias.  Skin: Negative for rash.  Neurological: Negative for tingling and headaches.  Psychiatric/Behavioral: Negative for depression. The patient is not nervous/anxious.     Objective:  BP (!) 145/86   Pulse 71   Temp 97.6 F (36.4 C) (Oral)   Ht 5\' 2"  (1.575 m)   Wt 211 lb 12.8 oz (96.1 kg)   LMP 11/25/2013   SpO2 99%   BMI 38.74 kg/m   BP/Weight 02/09/2017 01/17/2017 11/29/2016  Systolic BP 145 137 135  Diastolic BP 86 77 74  Wt. (Lbs) 211.8 216 207  BMI 38.74 37.08 35.53      Physical Exam  Constitutional: She is oriented to person, place, and time.  Well developed, obese, NAD, polite  HENT:  Head: Normocephalic and atraumatic.  Mouth/Throat: No oropharyngeal exudate.  Eyes:  Conjunctivae are normal. Pupils are equal, round, and reactive to light. No scleral icterus.  Neck: Normal range of motion. Neck supple. No JVD present. No thyromegaly present.  Cardiovascular: Normal rate, regular rhythm and normal heart sounds.   Pulmonary/Chest: Effort normal and breath sounds normal. No respiratory distress. She has no wheezes. She has no rales. She exhibits no tenderness.  Abdominal: Soft. Bowel sounds are normal. She exhibits no distension and no mass. There is no tenderness. There is no rebound and no guarding.  Genitourinary:  Genitourinary Comments: Atrophic vaginal tissue/loss of rugae, cervix normal, no discharge, no adnexal TTP/masses. Uterus without tenderness or masses.  Musculoskeletal: She exhibits no edema, tenderness or deformity.  LE and UE with full aROM bilaterally. Back with full aROM bilaterally.  Lymphadenopathy:    She has no cervical adenopathy.  Neurological: She is alert and oriented to person, place, and time. She has normal reflexes. No cranial nerve deficit. Coordination normal.  Skin: Skin is warm and dry. No rash noted. No erythema. No pallor.  Psychiatric: She has a normal mood and affect. Her behavior is normal. Thought content normal.  Vitals reviewed.    Assessment & Plan:   1. Annual physical exam - CBC with Differential - Comprehensive metabolic panel - Lipid Panel - TSH  2. Hypertension, unspecified type - Begin hydrochlorothiazide (HYDRODIURIL) 12.5 MG tablet; Take 1 tablet (12.5 mg total) by mouth daily. Take one tablet in  the morning.  Dispense: 90 tablet; Refill: 1  3. Screening for cervical cancer - Cytology - PAP [place]  4. Visit for screening mammogram - MM DIGITAL SCREENING BILATERAL; Future  5. Need for hepatitis C screening test - Hepatitis C Antibody  6. Encounter for screening colonoscopy - Ambulatory referral to Gastroenterology   Meds ordered this encounter  Medications  . hydrochlorothiazide  (HYDRODIURIL) 12.5 MG tablet    Sig: Take 1 tablet (12.5 mg total) by mouth daily. Take one tablet in the morning.    Dispense:  90 tablet    Refill:  1    Order Specific Question:   Supervising Provider    Answer:   Quentin Angst [id number]    Follow-up: Return in about 4 weeks (around 03/09/2017) for HTN.   Loletta Specter PA

## 2017-02-10 ENCOUNTER — Other Ambulatory Visit (INDEPENDENT_AMBULATORY_CARE_PROVIDER_SITE_OTHER): Payer: Self-pay | Admitting: Physician Assistant

## 2017-02-10 DIAGNOSIS — Z1159 Encounter for screening for other viral diseases: Secondary | ICD-10-CM

## 2017-02-10 LAB — CBC WITH DIFFERENTIAL/PLATELET
BASOS: 0 %
Basophils Absolute: 0 10*3/uL (ref 0.0–0.2)
EOS (ABSOLUTE): 0.1 10*3/uL (ref 0.0–0.4)
Eos: 2 %
Hematocrit: 36.8 % (ref 34.0–46.6)
Hemoglobin: 11.9 g/dL (ref 11.1–15.9)
IMMATURE GRANULOCYTES: 0 %
Immature Grans (Abs): 0 10*3/uL (ref 0.0–0.1)
Lymphocytes Absolute: 1.6 10*3/uL (ref 0.7–3.1)
Lymphs: 25 %
MCH: 30.1 pg (ref 26.6–33.0)
MCHC: 32.3 g/dL (ref 31.5–35.7)
MCV: 93 fL (ref 79–97)
Monocytes Absolute: 0.5 10*3/uL (ref 0.1–0.9)
Monocytes: 8 %
NEUTROS PCT: 65 %
Neutrophils Absolute: 4.1 10*3/uL (ref 1.4–7.0)
Platelets: 355 10*3/uL (ref 150–379)
RBC: 3.96 x10E6/uL (ref 3.77–5.28)
RDW: 15.3 % (ref 12.3–15.4)
WBC: 6.3 10*3/uL (ref 3.4–10.8)

## 2017-02-10 LAB — COMPREHENSIVE METABOLIC PANEL
ALT: 10 IU/L (ref 0–32)
AST: 14 IU/L (ref 0–40)
Albumin/Globulin Ratio: 1.4 (ref 1.2–2.2)
Albumin: 4.3 g/dL (ref 3.5–5.5)
Alkaline Phosphatase: 156 IU/L — ABNORMAL HIGH (ref 39–117)
BUN/Creatinine Ratio: 15 (ref 9–23)
BUN: 14 mg/dL (ref 6–24)
Bilirubin Total: 0.2 mg/dL (ref 0.0–1.2)
CALCIUM: 9.6 mg/dL (ref 8.7–10.2)
CO2: 25 mmol/L (ref 18–29)
CREATININE: 0.94 mg/dL (ref 0.57–1.00)
Chloride: 103 mmol/L (ref 96–106)
GFR calc Af Amer: 79 mL/min/{1.73_m2} (ref >59)
GFR calc non Af Amer: 69 mL/min/{1.73_m2} (ref >59)
Globulin, Total: 3 g/dL (ref 1.5–4.5)
Glucose: 83 mg/dL (ref 65–99)
Potassium: 4.3 mmol/L (ref 3.5–5.2)
Sodium: 143 mmol/L (ref 134–144)
Total Protein: 7.3 g/dL (ref 6.0–8.5)

## 2017-02-10 LAB — TSH: TSH: 1.19 u[IU]/mL (ref 0.450–4.500)

## 2017-02-10 LAB — LIPID PANEL
Chol/HDL Ratio: 2.4 ratio (ref 0.0–4.4)
Cholesterol, Total: 190 mg/dL (ref 100–199)
HDL: 78 mg/dL (ref >39)
LDL CALC: 97 mg/dL (ref 0–99)
TRIGLYCERIDES: 76 mg/dL (ref 0–149)
VLDL CHOLESTEROL CAL: 15 mg/dL (ref 5–40)

## 2017-02-10 LAB — CYTOLOGY - PAP
Bacterial vaginitis: POSITIVE — AB
Candida vaginitis: NEGATIVE
Chlamydia: NEGATIVE
Diagnosis: NEGATIVE
NEISSERIA GONORRHEA: NEGATIVE
TRICH (WINDOWPATH): NEGATIVE

## 2017-02-10 LAB — HEPATITIS C ANTIBODY: Hep C Virus Ab: 0.9 s/co ratio (ref 0.0–0.9)

## 2017-02-10 NOTE — Progress Notes (Signed)
Indeterminate HCV Ab.

## 2017-02-11 ENCOUNTER — Other Ambulatory Visit (INDEPENDENT_AMBULATORY_CARE_PROVIDER_SITE_OTHER): Payer: Self-pay | Admitting: Physician Assistant

## 2017-02-11 DIAGNOSIS — N760 Acute vaginitis: Principal | ICD-10-CM

## 2017-02-11 DIAGNOSIS — B9689 Other specified bacterial agents as the cause of diseases classified elsewhere: Secondary | ICD-10-CM | POA: Insufficient documentation

## 2017-02-11 MED ORDER — METRONIDAZOLE 500 MG PO TABS: 500.0000 mg | ORAL_TABLET | Freq: Two times a day (BID) | ORAL | 0 refills | Status: AC

## 2017-02-11 NOTE — Progress Notes (Signed)
BV positive

## 2017-02-15 LAB — CERVICOVAGINAL ANCILLARY ONLY: Herpes: NEGATIVE

## 2017-02-27 ENCOUNTER — Telehealth (INDEPENDENT_AMBULATORY_CARE_PROVIDER_SITE_OTHER): Payer: Self-pay | Admitting: Physician Assistant

## 2017-02-27 NOTE — Telephone Encounter (Signed)
Please advise patient to drink 8 cups of water per day (not soda or juices). She may be dehydrated with HCTZ. Return here if cramps persist despite drinking recommended quantity of water.

## 2017-02-27 NOTE — Telephone Encounter (Signed)
FWD to PCP. Tempestt S Roberts, CMA  

## 2017-02-27 NOTE — Telephone Encounter (Signed)
Left detailed message for patient with PCP instructions. Maryjean Mornempestt S Roberts, CMA

## 2017-02-27 NOTE — Telephone Encounter (Signed)
Patient called stated was taking hydrochlorothiazide (HYDRODIURIL) 12.5 MG tablet   But stopped taking because causing bad leg cramps. Patient would like to speak to nurse regarding what she can do to relieve leg cramps.  Please follow up with patient. [phone]

## 2017-03-10 ENCOUNTER — Encounter (INDEPENDENT_AMBULATORY_CARE_PROVIDER_SITE_OTHER): Payer: Self-pay | Admitting: Physician Assistant

## 2017-03-10 ENCOUNTER — Ambulatory Visit (INDEPENDENT_AMBULATORY_CARE_PROVIDER_SITE_OTHER): Payer: Self-pay | Admitting: Physician Assistant

## 2017-03-10 VITALS — BP 137/74 | HR 82 | Temp 98.5°F | Wt 212.6 lb

## 2017-03-10 DIAGNOSIS — E669 Obesity, unspecified: Secondary | ICD-10-CM

## 2017-03-10 DIAGNOSIS — Z6838 Body mass index (BMI) 38.0-38.9, adult: Secondary | ICD-10-CM

## 2017-03-10 DIAGNOSIS — I10 Essential (primary) hypertension: Secondary | ICD-10-CM | POA: Insufficient documentation

## 2017-03-10 MED ORDER — LOSARTAN POTASSIUM 50 MG PO TABS: 50.0000 mg | ORAL_TABLET | Freq: Every day | ORAL | 3 refills | Status: DC

## 2017-03-10 NOTE — Progress Notes (Signed)
Subjective:  Patient ID: Allison Pugh, female    DOB: 05-25-1961  Age: 56 y.o. MRN: [id number]  CC: f/u HTN  HPI Allison Pugh is a 56 y.o. female with a PMH of HTN presents to f/u on side effect of HCTZ. Patient took HCTZ for 2-3 days. Had to stop due to severe cramping of the lower extremities. Had tried drinking more water as advised but she was unable to tolerate cramps. Cramping has stopped when she stopped HCTZ. Patient expressed desire to lose weight through diet and exercise. Would like to be off anti-hypertensives if she is successful with diet and exercise. Does not endorse any other symptoms or complaints.    Outpatient Medications Prior to Visit  Medication Sig Dispense Refill  . acetaminophen (TYLENOL) 500 MG tablet Take 500 mg by mouth every 6 (six) hours as needed for mild pain.    Marland Kitchen ibuprofen (ADVIL,MOTRIN) 200 MG tablet Take 600 mg by mouth every 6 (six) hours as needed for moderate pain.    . Tetrahydrozoline HCl (EYE DROPS OP) Place 2 drops into both eyes daily as needed (dry eyes).    . hydrochlorothiazide (HYDRODIURIL) 12.5 MG tablet Take 1 tablet (12.5 mg total) by mouth daily. Take one tablet in the morning. (Patient not taking: Reported on 03/10/2017) 90 tablet 1   No facility-administered medications prior to visit.      ROS Review of Systems  Constitutional: Negative for chills, fever and malaise/fatigue.  Eyes: Negative for blurred vision.  Respiratory: Negative for shortness of breath.   Cardiovascular: Negative for chest pain and palpitations.  Gastrointestinal: Negative for abdominal pain and nausea.  Genitourinary: Negative for dysuria and hematuria.  Musculoskeletal: Negative for joint pain and myalgias.       LE cramps bilaterally while on HCTZ  Skin: Negative for rash.  Neurological: Negative for tingling and headaches.  Psychiatric/Behavioral: Negative for depression. The patient is not nervous/anxious.     Objective:  BP 137/74 (BP  Location: Left Arm, Patient Position: Sitting, Cuff Size: Large)   Pulse 82   Temp 98.5 F (36.9 C) (Oral)   Wt 212 lb 9.6 oz (96.4 kg)   LMP 11/25/2013   SpO2 100%   BMI 38.89 kg/m   BP/Weight 03/10/2017 02/09/2017 01/17/2017  Systolic BP 137 145 137  Diastolic BP 74 86 77  Wt. (Lbs) 212.6 211.8 216  BMI 38.89 38.74 37.08      Physical Exam  Constitutional: She is oriented to person, place, and time.  Well developed, obese, NAD, polite  HENT:  Head: Normocephalic and atraumatic.  Eyes: Conjunctivae are normal. No scleral icterus.  Neck: Normal range of motion. Neck supple. No thyromegaly present.  Cardiovascular: Normal rate, regular rhythm and normal heart sounds.   Pulmonary/Chest: Effort normal and breath sounds normal.  Musculoskeletal: She exhibits no edema.  Neurological: She is alert and oriented to person, place, and time. No cranial nerve deficit. Coordination normal.  Skin: Skin is warm and dry. No rash noted. No erythema. No pallor.  Psychiatric: She has a normal mood and affect. Her behavior is normal. Thought content normal.  Vitals reviewed.    Assessment & Plan:   1. Essential hypertension - Stop HCTZ - Begin Losartan 50mg  qday  2. Class 2 obesity with body mass index (BMI) of 38.0 to 38.9 in adult, unspecified obesity type, unspecified whether serious comorbidity present - Pt plans to diet and exercise.    Meds ordered this encounter  Medications  . losartan (COZAAR)  50 MG tablet    Sig: Take 1 tablet (50 mg total) by mouth daily.    Dispense:  90 tablet    Refill:  3    Order Specific Question:   Supervising Provider    Answer:   Quentin AngstJEGEDE, OLUGBEMIGA E [id number]    Follow-up: Return in about 3 months (around 06/10/2017) for HTN, and obesity.   Loletta Specteroger David Gomez PA

## 2017-03-10 NOTE — Patient Instructions (Signed)
Losartan tablets What is this medicine? LOSARTAN (loe SAR tan) is used to treat high blood pressure and to reduce the risk of stroke in certain patients. This drug also slows the progression of kidney disease in patients with diabetes. This medicine may be used for other purposes; ask your health care provider or pharmacist if you have questions. COMMON BRAND NAME(S): Cozaar What should I tell my health care provider before I take this medicine? They need to know if you have any of these conditions: -heart failure -kidney or liver disease -an unusual or allergic reaction to losartan, other medicines, foods, dyes, or preservatives -pregnant or trying to get pregnant -breast-feeding How should I use this medicine? Take this medicine by mouth with a glass of water. Follow the directions on the prescription label. This medicine can be taken with or without food. Take your doses at regular intervals. Do not take your medicine more often than directed. Talk to your pediatrician regarding the use of this medicine in children. Special care may be needed. Overdosage: If you think you have taken too much of this medicine contact a poison control center or emergency room at once. NOTE: This medicine is only for you. Do not share this medicine with others. What if I miss a dose? If you miss a dose, take it as soon as you can. If it is almost time for your next dose, take only that dose. Do not take double or extra doses. What may interact with this medicine? -blood pressure medicines -diuretics, especially triamterene, spironolactone, or amiloride -fluconazole -NSAIDs, medicines for pain and inflammation, like ibuprofen or naproxen -potassium salts or potassium supplements -rifampin This list may not describe all possible interactions. Give your health care provider a list of all the medicines, herbs, non-prescription drugs, or dietary supplements you use. Also tell them if you smoke, drink alcohol, or  use illegal drugs. Some items may interact with your medicine. What should I watch for while using this medicine? Visit your doctor or health care professional for regular checks on your progress. Check your blood pressure as directed. Ask your doctor or health care professional what your blood pressure should be and when you should contact him or her. Call your doctor or health care professional if you notice an irregular or fast heart beat. Women should inform their doctor if they wish to become pregnant or think they might be pregnant. There is a potential for serious side effects to an unborn child, particularly in the second or third trimester. Talk to your health care professional or pharmacist for more information. You may get drowsy or dizzy. Do not drive, use machinery, or do anything that needs mental alertness until you know how this drug affects you. Do not stand or sit up quickly, especially if you are an older patient. This reduces the risk of dizzy or fainting spells. Alcohol can make you more drowsy and dizzy. Avoid alcoholic drinks. Avoid salt substitutes unless you are told otherwise by your doctor or health care professional. Do not treat yourself for coughs, colds, or pain while you are taking this medicine without asking your doctor or health care professional for advice. Some ingredients may increase your blood pressure. What side effects may I notice from receiving this medicine? Side effects that you should report to your doctor or health care professional as soon as possible: -confusion, dizziness, light headedness or fainting spells -decreased amount of urine passed -difficulty breathing or swallowing, hoarseness, or tightening of the throat -fast or   irregular heart beat, palpitations, or chest pain -skin rash, itching -swelling of your face, lips, tongue, hands, or feet Side effects that usually do not require medical attention (report to your doctor or health care  professional if they continue or are bothersome): -cough -decreased sexual function or desire -headache -nasal congestion or stuffiness -nausea or stomach pain -sore or cramping muscles This list may not describe all possible side effects. Call your doctor for medical advice about side effects. You may report side effects to FDA at 1-800-FDA-1088. Where should I keep my medicine? Keep out of the reach of children. Store at room temperature between 15 and 30 degrees C (59 and 86 degrees F). Protect from light. Keep container tightly closed. Throw away any unused medicine after the expiration date. NOTE: This sheet is a summary. It may not cover all possible information. If you have questions about this medicine, talk to your doctor, pharmacist, or health care provider.  2018 Elsevier/Gold Standard (2007-11-23 16:42:18)  

## 2017-03-31 ENCOUNTER — Ambulatory Visit (INDEPENDENT_AMBULATORY_CARE_PROVIDER_SITE_OTHER): Payer: Self-pay

## 2017-04-21 ENCOUNTER — Ambulatory Visit (INDEPENDENT_AMBULATORY_CARE_PROVIDER_SITE_OTHER): Payer: Self-pay

## 2017-06-12 ENCOUNTER — Ambulatory Visit (INDEPENDENT_AMBULATORY_CARE_PROVIDER_SITE_OTHER): Payer: Self-pay | Admitting: Physician Assistant

## 2017-06-16 ENCOUNTER — Encounter (INDEPENDENT_AMBULATORY_CARE_PROVIDER_SITE_OTHER): Payer: Self-pay | Admitting: Physician Assistant

## 2017-06-16 ENCOUNTER — Ambulatory Visit (INDEPENDENT_AMBULATORY_CARE_PROVIDER_SITE_OTHER): Payer: Self-pay | Admitting: Physician Assistant

## 2017-06-16 VITALS — BP 143/83 | HR 56 | Temp 97.8°F | Wt 204.8 lb

## 2017-06-16 DIAGNOSIS — R748 Abnormal levels of other serum enzymes: Secondary | ICD-10-CM

## 2017-06-16 DIAGNOSIS — Z1211 Encounter for screening for malignant neoplasm of colon: Secondary | ICD-10-CM

## 2017-06-16 DIAGNOSIS — Z1239 Encounter for other screening for malignant neoplasm of breast: Secondary | ICD-10-CM

## 2017-06-16 DIAGNOSIS — I10 Essential (primary) hypertension: Secondary | ICD-10-CM

## 2017-06-16 DIAGNOSIS — Z1231 Encounter for screening mammogram for malignant neoplasm of breast: Secondary | ICD-10-CM

## 2017-06-16 DIAGNOSIS — R768 Other specified abnormal immunological findings in serum: Secondary | ICD-10-CM

## 2017-06-16 MED ORDER — LOSARTAN POTASSIUM 50 MG PO TABS: 50.0000 mg | ORAL_TABLET | Freq: Every day | ORAL | 3 refills | Status: DC

## 2017-06-16 NOTE — Patient Instructions (Signed)
Hepatitis C Hepatitis C is a viral infection of the liver. It can lead to scarring of the liver (cirrhosis), liver failure, or liver cancer. Hepatitis C may go undetected for months or years because people with the infection may not have symptoms, or they may have only mild symptoms. What are the causes? Hepatitis C is caused by the hepatitis C virus (HCV). The virus can be passed from one person to another through:  Blood.  Contaminated needles, such as those used for tattooing, body piercing, acupuncture, or injecting drugs.  Having unprotected sex with an infected person.  Childbirth.  Blood transfusions or organ transplants done in the [place] before 1992. What increases the risk? Risk factors for hepatitis C include:  Having unprotected sex with an infected person.  Using illegal drugs. What are the signs or symptoms? Symptoms of hepatitis C may include:  Fatigue.  Loss of appetite.  Nausea.  Vomiting.  Abdominal pain.  Dark yellow urine.  Yellowish skin and eyes (jaundice).  Itching of the skin.  Clay-colored bowel movements.  Joint pain. Symptoms are not always present. How is this diagnosed? Hepatitis C is diagnosed with blood tests. Other types of tests may also be done to check how your liver is functioning. How is this treated? Your health care provider may perform noninvasive tests or a liver biopsy to help determine the best course of treatment. Treatment for hepatitis C may include one or more medicines. Your health care provider may check you for a recurring infection or other liver conditions every 6-12 months after treatment. Follow these instructions at home:  Rest as needed.  Take all medicines as directed by your health care provider.  Do not take any medicine unless approved by your health care provider. This includes over-the-counter medicine and birth control pills.  Do not drink alcohol.  Do not have sex until approved by your  health care provider.  Do not share toothbrushes, nail clippers, razors, or needles. How is this prevented? There is no vaccine for hepatitis C. The only way to prevent the disease is to reduce the risk of exposure to the virus. This may be done by:  Practicing safe sex and using condoms.  Avoiding illegal drugs. Contact a health care provider if:  You have a fever.  You develop abdominal pain.  You develop dark urine.  You have clay-colored bowel movements.  You develop joint pains. Get help right away if:  You have increasing fatigue or weakness.  You lose your appetite.  You feel nauseous or vomit.  You develop jaundice or your jaundice gets worse.  You bruise or bleed easily. This information is not intended to replace advice given to you by your health care provider. Make sure you discuss any questions you have with your health care provider. Document Released: 09/09/2000 Document Revised: 02/18/2016 Document Reviewed: 12/25/2013 Elsevier Interactive Patient Education  2017 Elsevier Inc.  

## 2017-06-16 NOTE — Progress Notes (Signed)
Subjective:  Patient ID: Allison Pugh, female    DOB: 02/24/1961  Age: 56 y.o. MRN: [id number]  CC: HTN  HPI Allison Pugh is a 56 y.o. female with a medical history of HTN and positive HCV Ab presents for management of HTN. She is feeling well and walking more for exercise. Lost some weight, 8 lbs since June 2018. She has not begun to take the Losartan prescribed to her because she lost the prescription. Does not feel CP, palpitations, SOB, HA, abdominal pain, f/c/n/v, rash, or GI/GU symptoms.    HCV Ab was found to be positive in May 2018. HCV RNA quant was ordered but patient has not had blood drawn yet. No symptoms to report.    Outpatient Medications Prior to Visit  Medication Sig Dispense Refill  . acetaminophen (TYLENOL) 500 MG tablet Take 500 mg by mouth every 6 (six) hours as needed for mild pain.    Marland Kitchen ibuprofen (ADVIL,MOTRIN) 200 MG tablet Take 600 mg by mouth every 6 (six) hours as needed for moderate pain.    Marland Kitchen losartan (COZAAR) 50 MG tablet Take 1 tablet (50 mg total) by mouth daily. (Patient not taking: Reported on 06/16/2017) 90 tablet 3  . Tetrahydrozoline HCl (EYE DROPS OP) Place 2 drops into both eyes daily as needed (dry eyes).     No facility-administered medications prior to visit.      ROS Review of Systems  Constitutional: Negative for chills, fever and malaise/fatigue.  Eyes: Negative for blurred vision.  Respiratory: Negative for shortness of breath.   Cardiovascular: Negative for chest pain and palpitations.  Gastrointestinal: Negative for abdominal pain and nausea.  Genitourinary: Negative for dysuria and hematuria.  Musculoskeletal: Negative for joint pain and myalgias.  Skin: Negative for rash.  Neurological: Negative for tingling and headaches.  Psychiatric/Behavioral: Negative for depression. The patient is not nervous/anxious.     Objective:  BP (!) 153/80 (BP Location: Left Arm, Patient Position: Sitting, Cuff Size: Large)   Pulse (!) 56    Temp 97.8 F (36.6 C) (Oral)   Wt 204 lb 12.8 oz (92.9 kg)   LMP 11/25/2013   SpO2 99%   BMI 37.46 kg/m   BP/Weight 06/16/2017 03/10/2017 02/09/2017  Systolic BP 153 137 145  Diastolic BP 80 74 86  Wt. (Lbs) 204.8 212.6 211.8  BMI 37.46 38.89 38.74      Physical Exam  Constitutional: She is oriented to person, place, and time.  Well developed, obese, NAD, polite  HENT:  Head: Normocephalic and atraumatic.  Eyes: Conjunctivae are normal. No scleral icterus.  Neck: Normal range of motion. Neck supple. No thyromegaly present.  Cardiovascular: Normal rate, regular rhythm and normal heart sounds.   Pulmonary/Chest: Effort normal and breath sounds normal.  Musculoskeletal: She exhibits no edema.  Neurological: She is alert and oriented to person, place, and time. No cranial nerve deficit. Coordination normal.  Skin: Skin is warm and dry. No rash noted. No erythema. No pallor.  Psychiatric: She has a normal mood and affect. Her behavior is normal. Thought content normal.  Vitals reviewed.    Assessment & Plan:    1. Hypertension, unspecified type - Noncompliant with Losartan.  - Please begin taking losartan (COZAAR) 50 MG tablet; Take 1 tablet (50 mg total) by mouth daily.  Dispense: 90 tablet; Refill: 3  2. Hepatitis C antibody positive in blood - Hepatic Function Panel  3. Elevated liver enzymes - HCV RNA quant  4. Special screening for malignant neoplasms,  colon - Fecal occult blood, imunochemical  5. Screening for breast cancer - [facility] DIGITAL SCREENING BILATERAL; Future   Meds ordered this encounter  Medications  . losartan (COZAAR) 50 MG tablet    Sig: Take 1 tablet (50 mg total) by mouth daily.    Dispense:  90 tablet    Refill:  3    Order Specific Question:   Supervising Provider    Answer:   Quentin Angst [id number]    Follow-up: Return in about 2 months (around 08/16/2017) for HTN.   Loletta Specter PA

## 2017-06-18 LAB — HCV RNA QUANT: Hepatitis C Quantitation: NOT DETECTED IU/mL

## 2017-06-18 LAB — HEPATIC FUNCTION PANEL
ALBUMIN: 4.4 g/dL (ref 3.5–5.5)
ALK PHOS: 152 IU/L — AB (ref 39–117)
ALT: 9 IU/L (ref 0–32)
AST: 16 IU/L (ref 0–40)
BILIRUBIN, DIRECT: 0.08 mg/dL (ref 0.00–0.40)
Bilirubin Total: 0.3 mg/dL (ref 0.0–1.2)
TOTAL PROTEIN: 7.4 g/dL (ref 6.0–8.5)

## 2017-06-20 ENCOUNTER — Telehealth (INDEPENDENT_AMBULATORY_CARE_PROVIDER_SITE_OTHER): Payer: Self-pay

## 2017-06-20 NOTE — Telephone Encounter (Signed)
-----  Message from Clent Demark, PA-C sent at 06/19/2017  5:53 PM EDT ----- HCV not detected. We will work up Ward at her next visit since it is still elevated.

## 2017-06-20 NOTE — Telephone Encounter (Signed)
Patient is aware of all lab results and expressed understanding and thanks. Maryjean Morn, CMA

## 2017-08-16 ENCOUNTER — Other Ambulatory Visit: Payer: Self-pay

## 2017-08-16 ENCOUNTER — Ambulatory Visit (INDEPENDENT_AMBULATORY_CARE_PROVIDER_SITE_OTHER): Payer: Self-pay | Admitting: Physician Assistant

## 2017-08-16 ENCOUNTER — Encounter (INDEPENDENT_AMBULATORY_CARE_PROVIDER_SITE_OTHER): Payer: Self-pay | Admitting: Physician Assistant

## 2017-08-16 VITALS — BP 137/76 | HR 85 | Temp 98.6°F | Wt 206.8 lb

## 2017-08-16 DIAGNOSIS — R748 Abnormal levels of other serum enzymes: Secondary | ICD-10-CM

## 2017-08-16 DIAGNOSIS — Z1211 Encounter for screening for malignant neoplasm of colon: Secondary | ICD-10-CM

## 2017-08-16 DIAGNOSIS — Z1231 Encounter for screening mammogram for malignant neoplasm of breast: Secondary | ICD-10-CM

## 2017-08-16 DIAGNOSIS — Z1239 Encounter for other screening for malignant neoplasm of breast: Secondary | ICD-10-CM

## 2017-08-16 DIAGNOSIS — I10 Essential (primary) hypertension: Secondary | ICD-10-CM

## 2017-08-16 DIAGNOSIS — R8290 Unspecified abnormal findings in urine: Secondary | ICD-10-CM

## 2017-08-16 LAB — POCT URINALYSIS DIPSTICK
Glucose, UA: NEGATIVE
LEUKOCYTES UA: NEGATIVE
NITRITE UA: NEGATIVE
PH UA: 6 (ref 5.0–8.0)
PROTEIN UA: 30
RBC UA: NEGATIVE
Spec Grav, UA: >=1.03 — AB (ref 1.010–1.025)
UROBILINOGEN UA: 1 U/dL

## 2017-08-16 MED ORDER — AMLODIPINE BESYLATE 5 MG PO TABS: 5.0000 mg | ORAL_TABLET | Freq: Every day | ORAL | 3 refills | Status: DC

## 2017-08-16 MED ORDER — LOSARTAN POTASSIUM 100 MG PO TABS
100.0000 mg | ORAL_TABLET | Freq: Every day | ORAL | 3 refills | Status: DC
Start: 2017-08-16 — End: 2019-03-06

## 2017-08-16 NOTE — Patient Instructions (Signed)
Managing Your Hypertension Hypertension is commonly called high blood pressure. This is when the force of your blood pressing against the walls of your arteries is too strong. Arteries are blood vessels that carry blood from your heart throughout your body. Hypertension forces the heart to work harder to pump blood, and may cause the arteries to become narrow or stiff. Having untreated or uncontrolled hypertension can cause heart attack, stroke, kidney disease, and other problems. What are blood pressure readings? A blood pressure reading consists of a higher number over a lower number. Ideally, your blood pressure should be below 120/80. The first ("top") number is called the systolic pressure. It is a measure of the pressure in your arteries as your heart beats. The second ("bottom") number is called the diastolic pressure. It is a measure of the pressure in your arteries as the heart relaxes. What does my blood pressure reading mean? Blood pressure is classified into four stages. Based on your blood pressure reading, your health care provider may use the following stages to determine what type of treatment you need, if any. Systolic pressure and diastolic pressure are measured in a unit called mm Hg. Normal  Systolic pressure: below 120.  Diastolic pressure: below 80. Elevated  Systolic pressure: 120-129.  Diastolic pressure: below 80. Hypertension stage 1  Systolic pressure: 130-139.  Diastolic pressure: 80-89. Hypertension stage 2  Systolic pressure: 140 or above.  Diastolic pressure: 90 or above. What health risks are associated with hypertension? Managing your hypertension is an important responsibility. Uncontrolled hypertension can lead to:  A heart attack.  A stroke.  A weakened blood vessel (aneurysm).  Heart failure.  Kidney damage.  Eye damage.  Metabolic syndrome.  Memory and concentration problems.  What changes can I make to manage my  hypertension? Hypertension can be managed by making lifestyle changes and possibly by taking medicines. Your health care provider will help you make a plan to bring your blood pressure within a normal range. Eating and drinking  Eat a diet that is high in fiber and potassium, and low in salt (sodium), added sugar, and fat. An example eating plan is called the DASH (Dietary Approaches to Stop Hypertension) diet. To eat this way: ? Eat plenty of fresh fruits and vegetables. Try to fill half of your plate at each meal with fruits and vegetables. ? Eat whole grains, such as whole wheat pasta, brown rice, or whole grain bread. Fill about one quarter of your plate with whole grains. ? Eat low-fat diary products. ? Avoid fatty cuts of meat, processed or cured meats, and poultry with skin. Fill about one quarter of your plate with lean proteins such as fish, chicken without skin, beans, eggs, and tofu. ? Avoid premade and processed foods. These tend to be higher in sodium, added sugar, and fat.  Reduce your daily sodium intake. Most people with hypertension should eat less than 1,500 mg of sodium a day.  Limit alcohol intake to no more than 1 drink a day for nonpregnant women and 2 drinks a day for men. One drink equals 12 oz of beer, 5 oz of wine, or 1 oz of hard liquor. Lifestyle  Work with your health care provider to maintain a healthy body weight, or to lose weight. Ask what an ideal weight is for you.  Get at least 30 minutes of exercise that causes your heart to beat faster (aerobic exercise) most days of the week. Activities may include walking, swimming, or biking.  Include exercise   to strengthen your muscles (resistance exercise), such as weight lifting, as part of your weekly exercise routine. Try to do these types of exercises for 30 minutes at least 3 days a week.  Do not use any products that contain nicotine or tobacco, such as cigarettes and e-cigarettes. If you need help quitting, ask  your health care provider.  Control any long-term (chronic) conditions you have, such as high cholesterol or diabetes. Monitoring  Monitor your blood pressure at home as told by your health care provider. Your personal target blood pressure may vary depending on your medical conditions, your age, and other factors.  Have your blood pressure checked regularly, as often as told by your health care provider. Working with your health care provider  Review all the medicines you take with your health care provider because there may be side effects or interactions.  Talk with your health care provider about your diet, exercise habits, and other lifestyle factors that may be contributing to hypertension.  Visit your health care provider regularly. Your health care provider can help you create and adjust your plan for managing hypertension. Will I need medicine to control my blood pressure? Your health care provider may prescribe medicine if lifestyle changes are not enough to get your blood pressure under control, and if:  Your systolic blood pressure is 130 or higher.  Your diastolic blood pressure is 80 or higher.  Take medicines only as told by your health care provider. Follow the directions carefully. Blood pressure medicines must be taken as prescribed. The medicine does not work as well when you skip doses. Skipping doses also puts you at risk for problems. Contact a health care provider if:  You think you are having a reaction to medicines you have taken.  You have repeated (recurrent) headaches.  You feel dizzy.  You have swelling in your ankles.  You have trouble with your vision. Get help right away if:  You develop a severe headache or confusion.  You have unusual weakness or numbness, or you feel faint.  You have severe pain in your chest or abdomen.  You vomit repeatedly.  You have trouble breathing. Summary  Hypertension is when the force of blood pumping through  your arteries is too strong. If this condition is not controlled, it may put you at risk for serious complications.  Your personal target blood pressure may vary depending on your medical conditions, your age, and other factors. For most people, a normal blood pressure is less than 120/80.  Hypertension is managed by lifestyle changes, medicines, or both. Lifestyle changes include weight loss, eating a healthy, low-sodium diet, exercising more, and limiting alcohol. This information is not intended to replace advice given to you by your health care provider. Make sure you discuss any questions you have with your health care provider. Document Released: 06/06/2012 Document Revised: 08/10/2016 Document Reviewed: 08/10/2016 Elsevier Interactive Patient Education  2018 [employer] Inc.  

## 2017-08-16 NOTE — Progress Notes (Signed)
Subjective:  Patient ID: Allison Pugh, female    DOB: 20-Mar-1961  Age: 56 y.o. MRN: [id number]  CC: f/u HTN. Chest pain  HPI  Allison Pugh is a 56 y.o. female with a medical history of HTN and positive HCV Ab presents for management of HTN. Last BP 143/83 mmHg two months ago. BP 137/76 mmHg today. Does not take Losartan consistently. Has some chest pain on occasion. Described as sharp, short lived and reproduced with movement of the upper extremities. Does not endorse palpitations, SOB, HA, tingling, numbness, presyncope, syncope, abdominal pain, f/c/n/v, or GI symptoms.    Has noticed a strong urine odor. Would like to have her urine tested. Does not endorse urinary frequency, dysuria, suprapubic pressure, flank pain, or mid back pain.     Lost her FIT test for colon cancer screening. Requests another screening kit.   Outpatient Medications Prior to Visit  Medication Sig Dispense Refill  . acetaminophen (TYLENOL) 500 MG tablet Take 500 mg by mouth every 6 (six) hours as needed for mild pain.    Marland Kitchen ibuprofen (ADVIL,MOTRIN) 200 MG tablet Take 600 mg by mouth every 6 (six) hours as needed for moderate pain.    Marland Kitchen losartan (COZAAR) 50 MG tablet Take 1 tablet (50 mg total) by mouth daily. 90 tablet 3  . Tetrahydrozoline HCl (EYE DROPS OP) Place 2 drops into both eyes daily as needed (dry eyes).     No facility-administered medications prior to visit.      ROS Review of Systems  Constitutional: Negative for chills, fever and malaise/fatigue.  Eyes: Negative for blurred vision.  Respiratory: Negative for shortness of breath.   Cardiovascular: Negative for chest pain and palpitations.  Gastrointestinal: Negative for abdominal pain and nausea.  Genitourinary: Negative for dysuria and hematuria.       Strong urine smell  Musculoskeletal: Negative for joint pain and myalgias.  Skin: Negative for rash.  Neurological: Negative for tingling and headaches.  Psychiatric/Behavioral:  Negative for depression. The patient is not nervous/anxious.     Objective:  BP 137/76 (BP Location: Left Arm, Patient Position: Sitting, Cuff Size: Normal)   Pulse 85   Temp 98.6 F (37 C) (Oral)   Wt 206 lb 12.8 oz (93.8 kg)   LMP 11/25/2013   SpO2 93%   BMI 37.82 kg/m   BP/Weight 08/16/2017 06/16/2017 5/73/2202  Systolic BP 542 706 237  Diastolic BP 76 83 74  Wt. (Lbs) 206.8 204.8 212.6  BMI 37.82 37.46 38.89      Physical Exam  Constitutional: She is oriented to person, place, and time.  Well developed, overweight, NAD, polite  HENT:  Head: Normocephalic and atraumatic.  Eyes: No scleral icterus.  Neck: Normal range of motion. Neck supple. No thyromegaly present.  Cardiovascular: Normal rate, regular rhythm and normal heart sounds.  Pulmonary/Chest: Effort normal and breath sounds normal.  Abdominal: Soft. Bowel sounds are normal. There is no tenderness.  Musculoskeletal: She exhibits no edema.  Neurological: She is alert and oriented to person, place, and time.  Skin: Skin is warm and dry. No rash noted. No erythema. No pallor.  Psychiatric: She has a normal mood and affect. Her behavior is normal. Thought content normal.  Vitals reviewed.    Assessment & Plan:     1. Alkaline phosphatase elevation - Alkaline phosphatase, isoenzymes  2. Hypertension, unspecified type - Stop Hyzaar due to recall - Begin HCTZ 25 mg one tablet qam - Begin Amlodipine 10 mg one tablet every  evening  3. Screen for colon cancer - Fecal occult blood, imunochemical  4. Screening for breast cancer - I have already ordered mammogram at previous visit. I gave patient the map and phone number of the [facility] again.   5. Foul smelling urine - Urinalysis Dipstick negative for leukocytes and nitrites. Patient likely dehydrated. Advised her to drink more water. Patient agreed and will notify me if there is still any urinary concerns.   Meds ordered this encounter  Medications   . losartan (COZAAR) 100 MG tablet    Sig: Take 1 tablet (100 mg total) by mouth daily.    Dispense:  90 tablet    Refill:  3    Order Specific Question:   Supervising Provider    Answer:   Tresa Garter [id number]  . amLODipine (NORVASC) 5 MG tablet    Sig: Take 1 tablet (5 mg total) by mouth daily.    Dispense:  90 tablet    Refill:  3    Order Specific Question:   Supervising Provider    Answer:   Tresa Garter [[id number]]    Follow-up: 4 weeks for nurse visit BP  Clent Demark PA

## 2017-08-21 ENCOUNTER — Telehealth (INDEPENDENT_AMBULATORY_CARE_PROVIDER_SITE_OTHER): Payer: Self-pay

## 2017-08-21 LAB — ALKALINE PHOSPHATASE, ISOENZYMES
ALK PHOS: 144 IU/L — AB (ref 39–117)
BONE FRACTION: 25 % (ref 14–68)
INTESTINAL FRAC.: 12 % (ref 0–18)
LIVER FRACTION: 63 % (ref 18–85)

## 2017-08-21 NOTE — Telephone Encounter (Signed)
Patients voicemail is full, unable to leave message. Will attempt to call once more. Allison Pugh S Allison Pugh, CMA

## 2017-08-21 NOTE — Telephone Encounter (Signed)
-----   Message from Roger David Gomez, PA-C sent at 08/21/2017  1:28 PM EST ----- Alkaline Phosphatase elevation does not show liver, bone, or intestinal etiology. Other labs such as calcium and TSH do not point to secondary causes. I would like for her to Take OTC Vitamin D 1000 units daily. Thank you. 

## 2017-08-22 ENCOUNTER — Telehealth (INDEPENDENT_AMBULATORY_CARE_PROVIDER_SITE_OTHER): Payer: Self-pay

## 2017-08-22 NOTE — Telephone Encounter (Signed)
Pt aware of the following results, Alkaline Phosphatase elevation does not show liver, bone, or intestinal etiology. Other labs such as calcium and TSH do not point to secondary causes. I would like for her to Take OTC Vitamin D 1000 units daily. Maryjean Mornempestt S Roberts, CMA

## 2017-08-22 NOTE — Telephone Encounter (Signed)
-----   Message from Loletta Specteroger David Gomez, PA-C sent at 08/21/2017  1:28 PM EST ----- Alkaline Phosphatase elevation does not show liver, bone, or intestinal etiology. Other labs such as calcium and TSH do not point to secondary causes. I would like for her to Take OTC Vitamin D 1000 units daily. Thank you.

## 2017-09-13 ENCOUNTER — Other Ambulatory Visit: Payer: Self-pay

## 2017-09-13 ENCOUNTER — Encounter (INDEPENDENT_AMBULATORY_CARE_PROVIDER_SITE_OTHER): Payer: Self-pay

## 2017-09-13 ENCOUNTER — Ambulatory Visit (INDEPENDENT_AMBULATORY_CARE_PROVIDER_SITE_OTHER): Payer: Self-pay | Admitting: Physician Assistant

## 2017-09-13 VITALS — BP 119/74 | HR 83 | Temp 98.7°F | Wt 209.2 lb

## 2017-09-13 DIAGNOSIS — I10 Essential (primary) hypertension: Secondary | ICD-10-CM

## 2017-09-13 NOTE — Progress Notes (Signed)
Pt is not taking anti-hypertensives for three weeks. Yet, BP is normal today. Patient states she is watching her diet and drinking more water.

## 2018-10-15 ENCOUNTER — Encounter (INDEPENDENT_AMBULATORY_CARE_PROVIDER_SITE_OTHER): Payer: Self-pay | Admitting: Internal Medicine

## 2018-10-15 ENCOUNTER — Other Ambulatory Visit (HOSPITAL_COMMUNITY)
Admission: RE | Admit: 2018-10-15 | Discharge: 2018-10-15 | Disposition: A | Discharge status: Home / Self Care | Payer: Self-pay | Source: Ambulatory Visit | Attending: Internal Medicine | Admitting: Internal Medicine

## 2018-10-15 ENCOUNTER — Ambulatory Visit (INDEPENDENT_AMBULATORY_CARE_PROVIDER_SITE_OTHER): Payer: Self-pay | Admitting: Internal Medicine

## 2018-10-15 ENCOUNTER — Other Ambulatory Visit: Payer: Self-pay

## 2018-10-15 VITALS — BP 134/82 | HR 87 | Temp 97.8°F | Resp 16 | Wt 215.0 lb

## 2018-10-15 DIAGNOSIS — Z1211 Encounter for screening for malignant neoplasm of colon: Secondary | ICD-10-CM

## 2018-10-15 DIAGNOSIS — Z1239 Encounter for other screening for malignant neoplasm of breast: Secondary | ICD-10-CM

## 2018-10-15 DIAGNOSIS — Z0001 Encounter for general adult medical examination with abnormal findings: Secondary | ICD-10-CM

## 2018-10-15 DIAGNOSIS — Z0000 Encounter for general adult medical examination without abnormal findings: Secondary | ICD-10-CM

## 2018-10-15 DIAGNOSIS — N760 Acute vaginitis: Secondary | ICD-10-CM

## 2018-10-15 DIAGNOSIS — Z6839 Body mass index (BMI) 39.0-39.9, adult: Secondary | ICD-10-CM

## 2018-10-15 DIAGNOSIS — T7840XA Allergy, unspecified, initial encounter: Secondary | ICD-10-CM

## 2018-10-15 DIAGNOSIS — E6609 Other obesity due to excess calories: Secondary | ICD-10-CM

## 2018-10-15 DIAGNOSIS — Z8679 Personal history of other diseases of the circulatory system: Secondary | ICD-10-CM

## 2018-10-15 MED ORDER — FLUTICASONE PROPIONATE 50 MCG/ACT NA SUSP: 2.0000 | Freq: Every day | NASAL | 1 refills | Status: DC

## 2018-10-15 MED ORDER — LORATADINE 10 MG PO TABS: 10.0000 mg | ORAL_TABLET | Freq: Every day | ORAL | 1 refills | Status: DC

## 2018-10-15 NOTE — Patient Instructions (Signed)
Try to check your blood pressure at least twice a week.  The goal is 130/80 or lower.  Try to get back to the gym to exercise at least 3 to 4 days a week for 30 minutes.  Follow a Healthy Eating Plan - You can do it! Limit sugary drinks.  Avoid sodas, sweet tea, sport or energy drinks, or fruit drinks.  Drink water, lo-fat milk, or diet drinks. Limit snack foods.   Cut back on candy, cake, cookies, chips, ice cream.  These are a special treat, only in small amounts. Eat plenty of vegetables.  Especially dark green, red, and orange vegetables. Aim for at least 3 servings a day. More is better! Include fruit in your daily diet.  Whole fruit is much healthier than fruit juice! Limit "white" bread, "white" pasta, "white" rice.   Choose "100% whole grain" products, brown or wild rice. Avoid fatty meats. Try "Meatless Monday" and choose eggs or beans one day a week.  When eating meat, choose lean meats like chicken, [place], and fish.  Grill, broil, or bake meats instead of frying, and eat poultry without the skin. Eat less salt.  Avoid frozen pizzas, frozen dinners and salty foods.  Use seasonings other than salt in cooking.  This can help blood pressure and keep you from swelling Beer, wine and liquor have calories.  If you can safely drink alcohol, limit to 1 drink per day for women, 2 drinks for men

## 2018-10-15 NOTE — Progress Notes (Signed)
Patient ID: Allison Pugh, female    DOB: [birth year]  MRN: [id number]  CC: Gynecologic Exam   Subjective: Allison Pugh is a 58 y.o. female who presents for physical. Her concerns today include:  Pt with hx of HTN   Pt is G5/P5 Last pap 01/2017 was normal. C/o smelly vaginal dischgx3 mths. Looks brown/blk.  No dysuria.  Sexually active with one partner. Pt is menopausal Last MMG was age 43 GGM had breast CA  Has gym membership but has not gone since April of last year.  Up to that point she had changed her eating habits and has lost 20 pounds to avoid being placed back on blood pressure medications.  She is currently not on any blood pressure medications.  Past medical history, social history and family history reviewed.  Patient Active Problem List   Diagnosis Date Noted  . Essential hypertension 03/10/2017  . BV (bacterial vaginosis) 02/11/2017  . DERMATITIS, ALLERGIC 02/22/2008  . ALLERGIC RHINITIS 07/25/2007  . MYALGIA, HX OF 07/25/2007     Current Outpatient Medications on File Prior to Visit  Medication Sig Dispense Refill  . acetaminophen (TYLENOL) 500 MG tablet Take 500 mg by mouth every 6 (six) hours as needed for mild pain.    Marland Kitchen ibuprofen (ADVIL,MOTRIN) 200 MG tablet Take 600 mg by mouth every 6 (six) hours as needed for moderate pain.    . Tetrahydrozoline HCl (EYE DROPS OP) Place 2 drops into both eyes daily as needed (dry eyes).    Marland Kitchen amLODipine (NORVASC) 5 MG tablet Take 1 tablet (5 mg total) by mouth daily. (Patient not taking: Reported on 09/13/2017) 90 tablet 3  . losartan (COZAAR) 100 MG tablet Take 1 tablet (100 mg total) by mouth daily. (Patient not taking: Reported on 09/13/2017) 90 tablet 3   No current facility-administered medications on file prior to visit.     Allergies  Allergen Reactions  . Hydrochlorothiazide     cramps    Social History   Socioeconomic History  . Marital status: Single    Spouse name: Not on file  . Number of  children: 5  . Years of education: college  . Highest education level: Not on file  Occupational History  . Occupation: CNA  Social Needs  . Financial resource strain: Not on file  . Food insecurity:    Worry: Not on file    Inability: Not on file  . Transportation needs:    Medical: Not on file    Non-medical: Not on file  Tobacco Use  . Smoking status: Never Smoker  . Smokeless tobacco: Never Used  Substance and Sexual Activity  . Alcohol use: No  . Drug use: Yes    Frequency: 7.0 times per week    Types: Marijuana  . Sexual activity: Not on file  Lifestyle  . Physical activity:    Days per week: Not on file    Minutes per session: Not on file  . Stress: Not on file  Relationships  . Social connections:    Talks on phone: Not on file    Gets together: Not on file    Attends religious service: Not on file    Active member of club or organization: Not on file    Attends meetings of clubs or organizations: Not on file    Relationship status: Not on file  . Intimate partner violence:    Fear of current or ex partner: Not on file    Emotionally abused:  Not on file    Physically abused: Not on file    Forced sexual activity: Not on file  Other Topics Concern  . Not on file  Social History Narrative  . Not on file    History reviewed. No pertinent family history.  Past Surgical History:  Procedure Laterality Date  . DILATION AND CURETTAGE OF UTERUS      ROS: Review of Systems  HENT:       C/o sneezing and nasal congestion x 2 days.  No fever. No itchy eyes or throat.  Taking Tylenol and Ibuprofen  Eyes: Negative for visual disturbance.  Respiratory: Negative for cough and shortness of breath.   Cardiovascular: Negative for chest pain and leg swelling.  Gastrointestinal: Negative for abdominal pain and blood in stool.  Genitourinary: Negative for difficulty urinating and pelvic pain.  Musculoskeletal:       Some leg cramps at nights  Skin: Negative for rash.   Neurological: Negative for seizures.  Psychiatric/Behavioral: Negative for dysphoric mood. The patient is not nervous/anxious.     PHYSICAL EXAM: BP 134/82 (BP Location: Left Arm, Patient Position: Sitting, Cuff Size: Large)   Pulse 87   Temp 97.8 F (36.6 C) (Oral)   Resp 16   Wt 215 lb (97.5 kg)   LMP 11/25/2013   SpO2 99%   BMI 39.32 kg/m   Wt Readings from Last 3 Encounters:  10/15/18 215 lb (97.5 kg)  09/13/17 209 lb 3.2 oz (94.9 kg)  08/16/17 206 lb 12.8 oz (93.8 kg)    Physical Exam  General appearance - alert, well appearing, middle-aged African-American female and in no distress Mental status - normal mood, behavior, speech, dress, motor activity, and thought processes Eyes - pupils equal and reactive, extraocular eye movements intact Ears - bilateral TM's and external ear canals normal Nose -mild audible congestion.  She has moderate enlargement of both nasal turbinates. Mouth - mucous membranes moist, pharynx normal without lesions Neck - supple, no significant adenopathy Chest - clear to auscultation, no wheezes, rales or rhonchi, symmetric air entry Heart - normal rate, regular rhythm, normal S1, S2, no murmurs, rubs, clicks or gallops Abdomen - soft, nontender, nondistended, no masses or organomegaly Breasts - breasts appear normal, no suspicious masses, no skin or nipple changes or axillary nodes Pelvic -deferred as patient is not due for Pap smear Neurological - cranial nerves II through XII intact, motor and sensory grossly normal bilaterally Musculoskeletal - no joint tenderness, deformity or swelling Extremities - peripheral pulses normal, no pedal edema, no clubbing or cyanosis    ASSESSMENT AND PLAN:  1. Annual physical exam   2. Breast cancer screening - MM Digital Screening; Future  3. History of essential hypertension Not at goal of 130/80 or lower. Pt not wanting to get back on med so will work on [employer] physical activity  4. Class  2 obesity due to excess calories without serious comorbidity with body mass index (BMI) of 39.0 to 39.9 in adult Dietary counseling given.  Patient advised to avoid sugary drinks, cut back on portion sizes, cut back on eating white carbohydrates and try to eat more white meat than red meat. Encouraged to get in at least about 150 minutes of aerobic exercise per week  5. Acute vaginitis - Urine cytology ancillary only  6. Allergic state, initial encounter - loratadine (CLARITIN) 10 MG tablet; Take 1 tablet (10 mg total) by mouth daily.  Dispense: 30 tablet; Refill: 1 - fluticasone (FLONASE) 50  MCG/ACT nasal spray; Place 2 sprays into both nostrils daily.  Dispense: 16 g; Refill: 1  7. Colon cancer screening - Fecal occult blood, imunochemical([facility])  8.  Patient declined flu shot Patient was given the opportunity to ask questions.  Patient verbalized understanding of the plan and was able to repeat key elements of the plan.   Orders Placed This Encounter  Procedures  . Fecal occult blood, imunochemical([facility])  . MM Digital Screening     Requested Prescriptions   Signed Prescriptions Disp Refills  . loratadine (CLARITIN) 10 MG tablet 30 tablet 1    Sig: Take 1 tablet (10 mg total) by mouth daily.  . fluticasone (FLONASE) 50 MCG/ACT nasal spray 16 g 1    Sig: Place 2 sprays into both nostrils daily.    Return in about 6 weeks (around 11/26/2018) for BP recheck.  Jonah Blueeborah Johnson, MD, FACP

## 2018-10-15 NOTE — Progress Notes (Signed)
PE/ PAP  Head and nasal congestion onset yesterday.

## 2018-10-16 LAB — URINE CYTOLOGY ANCILLARY ONLY
Chlamydia: NEGATIVE
NEISSERIA GONORRHEA: NEGATIVE
Trichomonas: NEGATIVE

## 2018-10-17 LAB — URINE CYTOLOGY ANCILLARY ONLY: CANDIDA VAGINITIS: NEGATIVE

## 2018-10-18 ENCOUNTER — Telehealth (INDEPENDENT_AMBULATORY_CARE_PROVIDER_SITE_OTHER): Payer: Self-pay

## 2018-10-18 MED ORDER — METRONIDAZOLE 500 MG PO TABS: 500.0000 mg | ORAL_TABLET | Freq: Two times a day (BID) | ORAL | 0 refills | Status: DC

## 2018-10-18 NOTE — Addendum Note (Signed)
Addended by: Jonah Blue B on: 10/18/2018 09:13 PM   Modules accepted: Orders

## 2018-10-18 NOTE — Telephone Encounter (Signed)
Patient is aware that she is negative for trichomonas, chlamydia and gonorrhea are negative. Patient requested results for BV. CMA saw that the BV was positive. Informed patient that BV was positive and that I would alert PCP to send abx.Please send antibiotic to [facility] on [address]. Maryjean Morn, CMA

## 2018-10-18 NOTE — Telephone Encounter (Signed)
Rxn for Flagyl sent to [facility] to treat BV.

## 2018-10-18 NOTE — Telephone Encounter (Signed)
-----   Message from Marcine Matar, MD sent at 10/17/2018 10:59 AM EST ----- Let patient know that screening test for chlamydia, gonorrhea and trichomonas were negative.

## 2019-02-05 ENCOUNTER — Ambulatory Visit: Payer: Self-pay | Attending: Primary Care | Admitting: Primary Care

## 2019-02-05 ENCOUNTER — Other Ambulatory Visit: Payer: Self-pay

## 2019-02-05 ENCOUNTER — Encounter: Payer: Self-pay | Admitting: Primary Care

## 2019-02-05 VITALS — BP 152/87 | HR 77 | Temp 98.3°F | Resp 16 | Wt 228.0 lb

## 2019-02-05 DIAGNOSIS — T7840XA Allergy, unspecified, initial encounter: Secondary | ICD-10-CM

## 2019-02-05 DIAGNOSIS — I10 Essential (primary) hypertension: Secondary | ICD-10-CM

## 2019-02-05 DIAGNOSIS — Z111 Encounter for screening for respiratory tuberculosis: Secondary | ICD-10-CM

## 2019-02-05 DIAGNOSIS — Z131 Encounter for screening for diabetes mellitus: Secondary | ICD-10-CM

## 2019-02-05 DIAGNOSIS — I83813 Varicose veins of bilateral lower extremities with pain: Secondary | ICD-10-CM

## 2019-02-05 DIAGNOSIS — E669 Obesity, unspecified: Secondary | ICD-10-CM

## 2019-02-05 DIAGNOSIS — Z6841 Body Mass Index (BMI) 40.0 and over, adult: Secondary | ICD-10-CM

## 2019-02-05 DIAGNOSIS — Z8349 Family history of other endocrine, nutritional and metabolic diseases: Secondary | ICD-10-CM

## 2019-02-05 LAB — POCT GLYCOSYLATED HEMOGLOBIN (HGB A1C): Hemoglobin A1C: 5.4 % (ref 4.0–5.6)

## 2019-02-05 MED ORDER — IBUPROFEN 200 MG PO TABS: 600.0000 mg | ORAL_TABLET | Freq: Three times a day (TID) | ORAL | 1 refills | Status: AC | PRN

## 2019-02-05 MED ORDER — AMLODIPINE BESYLATE 5 MG PO TABS: 5.0000 mg | ORAL_TABLET | Freq: Every day | ORAL | 3 refills | Status: DC

## 2019-02-05 MED ORDER — FLUTICASONE PROPIONATE 50 MCG/ACT NA SUSP: 2.0000 | Freq: Every day | NASAL | 1 refills | Status: DC

## 2019-02-05 MED ORDER — GUAIFENESIN ER 600 MG PO TB12: 600.0000 mg | ORAL_TABLET | Freq: Two times a day (BID) | ORAL | 0 refills | Status: DC

## 2019-02-05 MED FILL — AMLODIPINE BESYLATE 5 MG TA: 5 | 30 days supply | Qty: 30 | Fill #0

## 2019-02-05 MED FILL — FLUTICASONE PROP 50 MCG SPR: 50 | 30 days supply | Qty: 16 | Fill #0

## 2019-02-05 NOTE — Progress Notes (Signed)
Taking blood pressure at home SBP- 160 DBP range in 90-100 Discontinued medication d/t side effects-   since she c/o light headed, dizzy, weight gain, edema

## 2019-02-05 NOTE — Progress Notes (Signed)
Established Patient Office Visit  Subjective:  Patient ID: Allison Pugh, female    DOB: 04/16/1961  Age: 58 y.o. MRN: [id number]  CC:  Chief Complaint  Patient presents with  . Hypertension    HPI ADRIENA MANFRE present for Bp follow up and needs a PPD test for work. She is a Emergency planning/management officer. Past medical history of HTN and positive HCV Ab never tx also dx 25 years ago. No s/s r/t HCV. She is feeling well and walking 2-3 miles a day in the last 2 weeks. Bp remains elevated unable to take previously prescribed medication r/t causes h/a and dizziness so she stopped taking meds 12/18.  Past Surgical History:  Procedure Laterality Date  . DILATION AND CURETTAGE OF UTERUS      History reviewed. No pertinent family history.  Social History   Socioeconomic History  . Marital status: Single    Spouse name: Not on file  . Number of children: 5  . Years of education: college  . Highest education level: Not on file  Occupational History  . Occupation: CNA  Social Needs  . Financial resource strain: Not on file  . Food insecurity:    Worry: Not on file    Inability: Not on file  . Transportation needs:    Medical: Not on file    Non-medical: Not on file  Tobacco Use  . Smoking status: Never Smoker  . Smokeless tobacco: Never Used  Substance and Sexual Activity  . Alcohol use: No  . Drug use: Yes    Frequency: 7.0 times per week    Types: Marijuana  . Sexual activity: Not on file  Lifestyle  . Physical activity:    Days per week: Not on file    Minutes per session: Not on file  . Stress: Not on file  Relationships  . Social connections:    Talks on phone: Not on file    Gets together: Not on file    Attends religious service: Not on file    Active member of club or organization: Not on file    Attends meetings of clubs or organizations: Not on file    Relationship status: Not on file  . Intimate partner violence:    Fear of current or ex partner: Not on file     Emotionally abused: Not on file    Physically abused: Not on file    Forced sexual activity: Not on file  Other Topics Concern  . Not on file  Social History Narrative  . Not on file    Outpatient Medications Prior to Visit  Medication Sig Dispense Refill  . acetaminophen (TYLENOL) 500 MG tablet Take 500 mg by mouth every 6 (six) hours as needed for mild pain.    Marland Kitchen loratadine (CLARITIN) 10 MG tablet Take 1 tablet (10 mg total) by mouth daily. (Patient not taking: Reported on 02/05/2019) 30 tablet 1  . losartan (COZAAR) 100 MG tablet Take 1 tablet (100 mg total) by mouth daily. (Patient not taking: Reported on 09/13/2017) 90 tablet 3  . metroNIDAZOLE (FLAGYL) 500 MG tablet Take 1 tablet (500 mg total) by mouth 2 (two) times daily. (Patient not taking: Reported on 02/05/2019) 14 tablet 0  . Tetrahydrozoline HCl (EYE DROPS OP) Place 2 drops into both eyes daily as needed (dry eyes).    Marland Kitchen amLODipine (NORVASC) 5 MG tablet Take 1 tablet (5 mg total) by mouth daily. (Patient not taking: Reported on 09/13/2017) 90 tablet  3  . fluticasone (FLONASE) 50 MCG/ACT nasal spray Place 2 sprays into both nostrils daily. (Patient not taking: Reported on 02/05/2019) 16 g 1  . ibuprofen (ADVIL,MOTRIN) 200 MG tablet Take 600 mg by mouth every 6 (six) hours as needed for moderate pain.     No facility-administered medications prior to visit.     Allergies  Allergen Reactions  . Hydrochlorothiazide     cramps    ROS Review of Systems  Constitutional: Negative.   HENT: Negative.   Eyes: Negative.   Respiratory: Negative.   Gastrointestinal: Negative.   Endocrine: Negative.   Genitourinary: Negative.   Musculoskeletal: Positive for arthralgias.  Skin: Negative.   Allergic/Immunologic: Negative.   Neurological: Negative.   Hematological: Negative.   Psychiatric/Behavioral: Positive for sleep disturbance. The patient is nervous/anxious.       Objective:    Physical Exam  Constitutional: She is  oriented to person, place, and time. She appears well-developed and well-nourished.  HENT:  Head: Normocephalic and atraumatic.  Eyes: EOM are normal.  Neck: Normal range of motion. Neck supple.  Cardiovascular: Normal rate and regular rhythm.  Pulmonary/Chest: Effort normal and breath sounds normal.  Abdominal: Soft. Bowel sounds are normal. She exhibits distension.  Musculoskeletal:        General: Edema present.     Comments: LE  Neurological: She is alert and oriented to person, place, and time.  Skin: Skin is warm and dry.  Psychiatric: She has a normal mood and affect.    BP (!) 152/87 (BP Location: Left Arm, Patient Position: Sitting, Cuff Size: Large)   Pulse 77   Temp 98.3 F (36.8 C) (Oral)   Resp 16   Wt 228 lb (103.4 kg)   LMP 11/25/2013   SpO2 98%   BMI 41.70 kg/m  Wt Readings from Last 3 Encounters:  02/05/19 228 lb (103.4 kg)  10/15/18 215 lb (97.5 kg)  09/13/17 209 lb 3.2 oz (94.9 kg)     Health Maintenance Due  Topic Date Due  . MAMMOGRAM  07/27/2011  . COLONOSCOPY  07/27/2011    There are no preventive care reminders to display for this patient.  Lab Results  Component Value Date   TSH 1.190 02/09/2017   Lab Results  Component Value Date   WBC 6.3 02/09/2017   HGB 11.9 02/09/2017   HCT 36.8 02/09/2017   MCV 93 02/09/2017   PLT 355 02/09/2017   Lab Results  Component Value Date   NA 143 02/09/2017   K 4.3 02/09/2017   CO2 25 02/09/2017   GLUCOSE 83 02/09/2017   BUN 14 02/09/2017   CREATININE 0.94 02/09/2017   BILITOT 0.3 06/16/2017   ALKPHOS 144 (H) 08/16/2017   AST 16 06/16/2017   ALT 9 06/16/2017   PROT 7.4 06/16/2017   ALBUMIN 4.4 06/16/2017   CALCIUM 9.6 02/09/2017   Lab Results  Component Value Date   CHOL 190 02/09/2017   Lab Results  Component Value Date   HDL 78 02/09/2017   Lab Results  Component Value Date   LDLCALC 97 02/09/2017   Lab Results  Component Value Date   TRIG 76 02/09/2017   Lab Results   Component Value Date   CHOLHDL 2.4 02/09/2017   No results found for: HGBA1C    Assessment & Plan:   Problem List Items Addressed This Visit    Essential hypertension   Relevant Medications   amLODipine (NORVASC) 5 MG tablet   Other Relevant Orders  Comprehensive metabolic panel    Other Visit Diagnoses    Screening examination for pulmonary tuberculosis    -  Primary   Relevant Orders   PPD (Completed)   Allergic state, initial encounter       Relevant Medications   fluticasone (FLONASE) 50 MCG/ACT nasal spray   Other Relevant Orders   CBC with Differential   Obesity (BMI 30.0-34.9)       Relevant Orders   Lipid panel   Family history of diabetes insipidus       Relevant Orders   POCT HgB A1C     Leelee was seen today for hypertension.  Diagnoses and all orders for this visit:  Screening examination for pulmonary tuberculosis -     PPD  Essential hypertension -     Comprehensive metabolic panel  Allergic state, initial encounter -     fluticasone (FLONASE) 50 MCG/ACT nasal spray; Place 2 sprays into both nostrils daily. -     CBC with Differential  Obesity (BMI 30.0-34.9) -     Lipid panel  Family history of diabetes insipidus -     POCT HgB A1C  Other orders -     ibuprofen (ADVIL) 200 MG tablet; Take 3 tablets (600 mg total) by mouth every 8 (eight) hours as needed for up to 30 days for headache or moderate pain. -     guaiFENesin (MUCINEX) 600 MG 12 hr tablet; Take 1 tablet (600 mg total) by mouth 2 (two) times daily for 30 days. -     amLODipine (NORVASC) 5 MG tablet; Take 1 tablet (5 mg total) by mouth daily for 30 days.   Meds ordered this encounter  Medications  . ibuprofen (ADVIL) 200 MG tablet    Sig: Take 3 tablets (600 mg total) by mouth every 8 (eight) hours as needed for up to 30 days for headache or moderate pain.    Dispense:  90 tablet    Refill:  1  . fluticasone (FLONASE) 50 MCG/ACT nasal spray    Sig: Place 2 sprays into both  nostrils daily.    Dispense:  16 g    Refill:  1  . guaiFENesin (MUCINEX) 600 MG 12 hr tablet    Sig: Take 1 tablet (600 mg total) by mouth 2 (two) times daily for 30 days.    Dispense:  60 tablet    Refill:  0  . amLODipine (NORVASC) 5 MG tablet    Sig: Take 1 tablet (5 mg total) by mouth daily for 30 days.    Dispense:  30 tablet    Refill:  3    Follow-up: Return in about 4 weeks (around 03/05/2019) for Bp ck .    Grayce Sessions, NP

## 2019-02-05 NOTE — Patient Instructions (Signed)

## 2019-02-06 LAB — CBC WITH DIFFERENTIAL/PLATELET
Basophils Absolute: 0 10*3/uL (ref 0.0–0.2)
Basos: 0 %
EOS (ABSOLUTE): 0.1 10*3/uL (ref 0.0–0.4)
Eos: 2 %
Hematocrit: 38.3 % (ref 34.0–46.6)
Hemoglobin: 12.6 g/dL (ref 11.1–15.9)
Immature Grans (Abs): 0 10*3/uL (ref 0.0–0.1)
Immature Granulocytes: 0 %
Lymphocytes Absolute: 2 10*3/uL (ref 0.7–3.1)
Lymphs: 36 %
MCH: 29.5 pg (ref 26.6–33.0)
MCHC: 32.9 g/dL (ref 31.5–35.7)
MCV: 90 fL (ref 79–97)
Monocytes Absolute: 0.6 10*3/uL (ref 0.1–0.9)
Monocytes: 11 %
Neutrophils Absolute: 2.9 10*3/uL (ref 1.4–7.0)
Neutrophils: 51 %
Platelets: 359 10*3/uL (ref 150–450)
RBC: 4.27 x10E6/uL (ref 3.77–5.28)
RDW: 13.4 % (ref 11.7–15.4)
WBC: 5.7 10*3/uL (ref 3.4–10.8)

## 2019-02-06 LAB — LIPID PANEL
Chol/HDL Ratio: 2.5 ratio (ref 0.0–4.4)
Cholesterol, Total: 199 mg/dL (ref 100–199)
HDL: 81 mg/dL (ref >39)
LDL Calculated: 103 mg/dL — ABNORMAL HIGH (ref 0–99)
Triglycerides: 75 mg/dL (ref 0–149)
VLDL Cholesterol Cal: 15 mg/dL (ref 5–40)

## 2019-02-06 LAB — COMPREHENSIVE METABOLIC PANEL
ALT: 11 IU/L (ref 0–32)
AST: 14 IU/L (ref 0–40)
Albumin/Globulin Ratio: 1.6 (ref 1.2–2.2)
Albumin: 4.4 g/dL (ref 3.8–4.9)
Alkaline Phosphatase: 148 IU/L — ABNORMAL HIGH (ref 39–117)
BUN/Creatinine Ratio: 23 (ref 9–23)
BUN: 19 mg/dL (ref 6–24)
Bilirubin Total: 0.3 mg/dL (ref 0.0–1.2)
CO2: 23 mmol/L (ref 20–29)
Calcium: 10 mg/dL (ref 8.7–10.2)
Chloride: 104 mmol/L (ref 96–106)
Creatinine, Ser: 0.81 mg/dL (ref 0.57–1.00)
GFR calc Af Amer: 93 mL/min/{1.73_m2} (ref >59)
GFR calc non Af Amer: 81 mL/min/{1.73_m2} (ref >59)
Globulin, Total: 2.8 g/dL (ref 1.5–4.5)
Glucose: 96 mg/dL (ref 65–99)
Potassium: 4.5 mmol/L (ref 3.5–5.2)
Sodium: 141 mmol/L (ref 134–144)
Total Protein: 7.2 g/dL (ref 6.0–8.5)

## 2019-03-05 ENCOUNTER — Ambulatory Visit (INDEPENDENT_AMBULATORY_CARE_PROVIDER_SITE_OTHER): Payer: Self-pay | Admitting: Primary Care

## 2019-03-06 ENCOUNTER — Other Ambulatory Visit: Payer: Self-pay

## 2019-03-06 ENCOUNTER — Encounter (INDEPENDENT_AMBULATORY_CARE_PROVIDER_SITE_OTHER): Payer: Self-pay | Admitting: Primary Care

## 2019-03-06 ENCOUNTER — Ambulatory Visit (INDEPENDENT_AMBULATORY_CARE_PROVIDER_SITE_OTHER): Payer: Self-pay | Admitting: Primary Care

## 2019-03-06 VITALS — BP 111/75 | HR 95 | Temp 98.9°F | Ht 62.0 in | Wt 227.4 lb

## 2019-03-06 DIAGNOSIS — I10 Essential (primary) hypertension: Secondary | ICD-10-CM

## 2019-03-06 DIAGNOSIS — Z713 Dietary counseling and surveillance: Secondary | ICD-10-CM

## 2019-03-06 NOTE — Progress Notes (Signed)
Stopped taking BP meds because she began breaking out in hives and swelling

## 2019-03-06 NOTE — Progress Notes (Signed)
Subjective:  Patient ID: Allison Pugh, female    DOB: [birth year]  Age: 58 y.o. MRN: [id number]  CC: Hypertension   HPI Allison PuffCheryl J Crescenzo presents for Bp f/u however patient stopped taking Bp meds when she broke out in a rash and her face swelled up. She immediately took benadryl and wait to be seen at this appt.  Outpatient Medications Prior to Visit  Medication Sig Dispense Refill  . acetaminophen (TYLENOL) 500 MG tablet Take 500 mg by mouth every 6 (six) hours as needed for mild pain.    Marland Kitchen. ibuprofen (ADVIL) 200 MG tablet Take 3 tablets (600 mg total) by mouth every 8 (eight) hours as needed for up to 30 days for headache or moderate pain. 90 tablet 1  . loratadine (CLARITIN) 10 MG tablet Take 1 tablet (10 mg total) by mouth daily. 30 tablet 1  . amLODipine (NORVASC) 5 MG tablet Take 1 tablet (5 mg total) by mouth daily for 30 days. (Patient not taking: Reported on 03/06/2019) 30 tablet 3  . fluticasone (FLONASE) 50 MCG/ACT nasal spray Place 2 sprays into both nostrils daily. (Patient not taking: Reported on 03/06/2019) 16 g 1  . guaiFENesin (MUCINEX) 600 MG 12 hr tablet Take 1 tablet (600 mg total) by mouth 2 (two) times daily for 30 days. 60 tablet 0  . losartan (COZAAR) 100 MG tablet Take 1 tablet (100 mg total) by mouth daily. (Patient not taking: Reported on 09/13/2017) 90 tablet 3  . metroNIDAZOLE (FLAGYL) 500 MG tablet Take 1 tablet (500 mg total) by mouth 2 (two) times daily. (Patient not taking: Reported on 02/05/2019) 14 tablet 0  . Tetrahydrozoline HCl (EYE DROPS OP) Place 2 drops into both eyes daily as needed (dry eyes).     No facility-administered medications prior to visit.     ROS Review of Systems  Constitutional: Negative.   HENT: Negative.   Eyes: Negative.   Respiratory: Negative.   Cardiovascular: Negative.   Gastrointestinal: Negative.   Genitourinary: Negative.   Musculoskeletal: Negative.   Skin:       Recently had a allergic reaction to Norvasc   Neurological: Negative.   Psychiatric/Behavioral: Negative.     Objective:  BP 111/75 (BP Location: Left Arm, Patient Position: Sitting, Cuff Size: Large)   Pulse 95   Temp 98.9 F (37.2 C) (Oral)   Ht 5\' 2"  (1.575 m)   Wt 227 lb 6.4 oz (103.1 kg)   LMP 11/25/2013   SpO2 99%   BMI 41.59 kg/m    BP/Weight 03/06/2019 02/05/2019 10/15/2018  Systolic BP 111 152 134  Diastolic BP 75 87 82  Wt. (Lbs) 227.4 228 215  BMI 41.59 41.7 39.32    Physical Exam Constitutional:      Appearance: Normal appearance. She is obese.  HENT:     Head: Normocephalic.  Neck:     Musculoskeletal: Neck supple.  Cardiovascular:     Rate and Rhythm: Normal rate and regular rhythm.  Abdominal:     General: Bowel sounds are normal.     Palpations: Abdomen is soft.  Musculoskeletal: Normal range of motion.  Skin:    General: Skin is warm and dry.  Neurological:     Mental Status: She is alert and oriented to person, place, and time.  Psychiatric:        Mood and Affect: Mood normal.      Assessment & Plan:   There are no diagnoses linked to this encounter. Elnita MaxwellCheryl was seen today  for hypertension.  Diagnoses and all orders for this visit:  Hypertension, unspecified type Bp unremarkable stop medication secondary to side effects  This is a chronic problem. The current episode started more than 1 year ago. The problem has been rapidly improving since onset. The problem is controlled. Associated symptoms include anxiety. There are no associated agents to hypertension. Risk factors for coronary artery disease include obesity. Past treatments include nothing. The current treatment provides significant improvement.   Morbid obesity ([place]) Increase exercise she had stopped do to pandemic and social distancing and stop her gym membership   Encounter for nutritional counseling Discussed carb intake and provided a 1800 calorie diet for weight loss     Follow-up: Return for f/u on HTN.   Kerin Perna NP

## 2019-03-06 NOTE — Patient Instructions (Signed)
Carbohydrate Counting for Diabetes Mellitus, Adult  Carbohydrate counting is a method of keeping track of how many carbohydrates you eat. Eating carbohydrates naturally increases the amount of sugar (glucose) in the blood. Counting how many carbohydrates you eat helps keep your blood glucose within normal limits, which helps you manage your diabetes (diabetes mellitus). It is important to know how many carbohydrates you can safely have in each meal. This is different for every person. A diet and nutrition specialist (registered dietitian) can help you make a meal plan and calculate how many carbohydrates you should have at each meal and snack. Carbohydrates are found in the following foods:  Grains, such as breads and cereals.  Dried beans and soy products.  Starchy vegetables, such as potatoes, peas, and corn.  Fruit and fruit juices.  Milk and yogurt.  Sweets and snack foods, such as cake, cookies, candy, chips, and soft drinks. How do I count carbohydrates? There are two ways to count carbohydrates in food. You can use either of the methods or a combination of both. Reading "Nutrition Facts" on packaged food The "Nutrition Facts" list is included on the labels of almost all packaged foods and beverages in the [place] It includes:  The serving size.  Information about nutrients in each serving, including the grams (g) of carbohydrate per serving. To use the "Nutrition Facts":  Decide how many servings you will have.  Multiply the number of servings by the number of carbohydrates per serving.  The resulting number is the total amount of carbohydrates that you will be having. Learning standard serving sizes of other foods When you eat carbohydrate foods that are not packaged or do not include "Nutrition Facts" on the label, you need to measure the servings in order to count the amount of carbohydrates:  Measure the foods that you will eat with a food scale or measuring cup, if needed.   Decide how many standard-size servings you will eat.  Multiply the number of servings by 15. Most carbohydrate-rich foods have about 15 g of carbohydrates per serving. ? For example, if you eat 8 oz (170 g) of strawberries, you will have eaten 2 servings and 30 g of carbohydrates (2 servings x 15 g = 30 g).  For foods that have more than one food mixed, such as soups and casseroles, you must count the carbohydrates in each food that is included. The following list contains standard serving sizes of common carbohydrate-rich foods. Each of these servings has about 15 g of carbohydrates:   hamburger bun or  English muffin.   oz (15 mL) syrup.   oz (14 g) jelly.  1 slice of bread.  1 six-inch tortilla.  3 oz (85 g) cooked rice or pasta.  4 oz (113 g) cooked dried beans.  4 oz (113 g) starchy vegetable, such as peas, corn, or potatoes.  4 oz (113 g) hot cereal.  4 oz (113 g) mashed potatoes or  of a large baked potato.  4 oz (113 g) canned or frozen fruit.  4 oz (120 mL) fruit juice.  4-6 crackers.  6 chicken nuggets.  6 oz (170 g) unsweetened dry cereal.  6 oz (170 g) plain fat-free yogurt or yogurt sweetened with artificial sweeteners.  8 oz (240 mL) milk.  8 oz (170 g) fresh fruit or one small piece of fruit.  24 oz (680 g) popped popcorn. Example of carbohydrate counting Sample meal  3 oz (85 g) chicken breast.  6 oz (170 g)   brown rice.  4 oz (113 g) corn.  8 oz (240 mL) milk.  8 oz (170 g) strawberries with sugar-free whipped topping. Carbohydrate calculation 1. Identify the foods that contain carbohydrates: ? Rice. ? Corn. ? Milk. ? Strawberries. 2. Calculate how many servings you have of each food: ? 2 servings rice. ? 1 serving corn. ? 1 serving milk. ? 1 serving strawberries. 3. Multiply each number of servings by 15 g: ? 2 servings rice x 15 g = 30 g. ? 1 serving corn x 15 g = 15 g. ? 1 serving milk x 15 g = 15 g. ? 1 serving  strawberries x 15 g = 15 g. 4. Add together all of the amounts to find the total grams of carbohydrates eaten: ? 30 g + 15 g + 15 g + 15 g = 75 g of carbohydrates total. Summary  Carbohydrate counting is a method of keeping track of how many carbohydrates you eat.  Eating carbohydrates naturally increases the amount of sugar (glucose) in the blood.  Counting how many carbohydrates you eat helps keep your blood glucose within normal limits, which helps you manage your diabetes.  A diet and nutrition specialist (registered dietitian) can help you make a meal plan and calculate how many carbohydrates you should have at each meal and snack. This information is not intended to replace advice given to you by your health care provider. Make sure you discuss any questions you have with your health care provider. Document Released: 09/12/2005 Document Revised: 03/22/2017 Document Reviewed: 02/24/2016 Elsevier Interactive Patient Education  2019 Elsevier Inc[employer]  

## 2019-09-04 ENCOUNTER — Other Ambulatory Visit: Payer: Self-pay

## 2019-09-04 ENCOUNTER — Encounter (INDEPENDENT_AMBULATORY_CARE_PROVIDER_SITE_OTHER): Payer: Self-pay | Admitting: Primary Care

## 2019-09-04 ENCOUNTER — Ambulatory Visit (INDEPENDENT_AMBULATORY_CARE_PROVIDER_SITE_OTHER): Payer: Self-pay | Admitting: Primary Care

## 2019-09-04 VITALS — BP 134/78 | HR 88 | Temp 97.1°F | Ht 62.0 in | Wt 229.4 lb

## 2019-09-04 DIAGNOSIS — Z6839 Body mass index (BMI) 39.0-39.9, adult: Secondary | ICD-10-CM

## 2019-09-04 DIAGNOSIS — I10 Essential (primary) hypertension: Secondary | ICD-10-CM

## 2019-09-04 DIAGNOSIS — E6609 Other obesity due to excess calories: Secondary | ICD-10-CM

## 2019-09-04 DIAGNOSIS — Z6841 Body Mass Index (BMI) 40.0 and over, adult: Secondary | ICD-10-CM

## 2019-09-04 MED ORDER — CLONIDINE HCL 0.1 MG PO TABS: 0.2000 mg | ORAL_TABLET | Freq: Once | ORAL | Status: DC

## 2019-09-04 MED ORDER — AMLODIPINE BESYLATE 10 MG PO TABS: 10.0000 mg | ORAL_TABLET | Freq: Every day | ORAL | 5 refills | Status: DC

## 2019-09-04 NOTE — Progress Notes (Signed)
Established Patient Office Visit  Subjective:  Patient ID: Allison Pugh, female    DOB: 10/29/1960  Age: 58 y.o. MRN: [id number]  CC:  Chief Complaint  Patient presents with  . Follow-up    HTN    HPI Allison Pugh presents for management of blood pressure. She denies shortness of breath, headaches, chest pain or lower extremity edema. Stopped taking amlodipine facial swelling and hives. Patient felt it was related to medication. Advised her she did take appropriate precautions but always keep benadryl for any type of reactions especially facial swelling.  No past medical history on file.  Past Surgical History:  Procedure Laterality Date  . DILATION AND CURETTAGE OF UTERUS      No family history on file.  Social History   Socioeconomic History  . Marital status: Single    Spouse name: Not on file  . Number of children: 5  . Years of education: college  . Highest education level: Not on file  Occupational History  . Occupation: CNA  Social Needs  . Financial resource strain: Not on file  . Food insecurity    Worry: Not on file    Inability: Not on file  . Transportation needs    Medical: Not on file    Non-medical: Not on file  Tobacco Use  . Smoking status: Never Smoker  . Smokeless tobacco: Never Used  Substance and Sexual Activity  . Alcohol use: No  . Drug use: Yes    Frequency: 7.0 times per week    Types: Marijuana  . Sexual activity: Not on file  Lifestyle  . Physical activity    Days per week: Not on file    Minutes per session: Not on file  . Stress: Not on file  Relationships  . Social Herbalist on phone: Not on file    Gets together: Not on file    Attends religious service: Not on file    Active member of club or organization: Not on file    Attends meetings of clubs or organizations: Not on file    Relationship status: Not on file  . Intimate partner violence    Fear of current or ex partner: Not on file    Emotionally  abused: Not on file    Physically abused: Not on file    Forced sexual activity: Not on file  Other Topics Concern  . Not on file  Social History Narrative  . Not on file    Outpatient Medications Prior to Visit  Medication Sig Dispense Refill  . acetaminophen (TYLENOL) 500 MG tablet Take 500 mg by mouth every 6 (six) hours as needed for mild pain.    Marland Kitchen loratadine (CLARITIN) 10 MG tablet Take 1 tablet (10 mg total) by mouth daily. 30 tablet 1  . amLODipine (NORVASC) 5 MG tablet Take 1 tablet (5 mg total) by mouth daily for 30 days. (Patient not taking: Reported on 03/06/2019) 30 tablet 3  . fluticasone (FLONASE) 50 MCG/ACT nasal spray Place 2 sprays into both nostrils daily. (Patient not taking: Reported on 03/06/2019) 16 g 1   No facility-administered medications prior to visit.     Allergies  Allergen Reactions  . Hydrochlorothiazide     cramps    ROS Review of Systems  All other systems reviewed and are negative.     Objective:    Physical Exam  Constitutional: She is oriented to person, place, and time. She appears well-developed and  well-nourished.  HENT:  Head: Normocephalic.  Neck: Normal range of motion. Neck supple.  Cardiovascular: Normal rate and regular rhythm.  Abdominal: Soft. Bowel sounds are normal. She exhibits distension.  Musculoskeletal: Normal range of motion.  Neurological: She is oriented to person, place, and time.  Skin: Skin is warm and dry.  Psychiatric: She has a normal mood and affect.    BP 134/78 (BP Location: Right Arm, Patient Position: Sitting, Cuff Size: Normal)   Pulse 88   Temp (!) 97.1 F (36.2 C) (Temporal)   Ht 5\' 2"  (1.575 m)   Wt 229 lb 6.4 oz (104.1 kg)   LMP 11/25/2013   SpO2 94%   BMI 41.96 kg/m  Wt Readings from Last 3 Encounters:  09/04/19 229 lb 6.4 oz (104.1 kg)  03/06/19 227 lb 6.4 oz (103.1 kg)  02/05/19 228 lb (103.4 kg)     Health Maintenance Due  Topic Date Due  . MAMMOGRAM  07/27/2011  . COLONOSCOPY   07/27/2011    There are no preventive care reminders to display for this patient.  Lab Results  Component Value Date   TSH 1.190 02/09/2017   Lab Results  Component Value Date   WBC 5.7 02/05/2019   HGB 12.6 02/05/2019   HCT 38.3 02/05/2019   MCV 90 02/05/2019   PLT 359 02/05/2019   Lab Results  Component Value Date   NA 141 02/05/2019   K 4.5 02/05/2019   CO2 23 02/05/2019   GLUCOSE 96 02/05/2019   BUN 19 02/05/2019   CREATININE 0.81 02/05/2019   BILITOT 0.3 02/05/2019   ALKPHOS 148 (H) 02/05/2019   AST 14 02/05/2019   ALT 11 02/05/2019   PROT 7.2 02/05/2019   ALBUMIN 4.4 02/05/2019   CALCIUM 10.0 02/05/2019   Lab Results  Component Value Date   CHOL 199 02/05/2019   Lab Results  Component Value Date   HDL 81 02/05/2019   Lab Results  Component Value Date   LDLCALC 103 (H) 02/05/2019   Lab Results  Component Value Date   TRIG 75 02/05/2019   Lab Results  Component Value Date   CHOLHDL 2.5 02/05/2019   Lab Results  Component Value Date   HGBA1C 5.4 02/05/2019      Assessment & Plan:  Allison Pugh was seen today for follow-up.  Diagnoses and all orders for this visit:  Hypertension, unspecified type Goal of Bp 130/80 agreed to lifestyle modification and diet change to include low sodium diet , no cans foods , salty snacks and read labels re-evaluate Bp in 3 months to determine if medication is needed.  Class 2 obesity due to excess calories without serious comorbidity with body mass index (BMI) of 39.0 to 39.9 in adult Obesity is 30-39 indicating an excess in caloric intake. This may lead to other co-morbidities discussed with HTN ,CVD and diabetes . Lifestyle modifications of diet and exercise may reduce obesity.     No orders of the defined types were placed in this encounter.   Follow-up: No follow-ups on file.    Elnita Maxwell, NP

## 2019-09-04 NOTE — Patient Instructions (Signed)
Check blood pressure daily at the same time. Keep a log of all blood pressure readings.  DASH DIET; No salt or low sodium diet  If pressure if greater than 150/100 notify me here at the office.  If it's persistently greater 180/90, go to the Emergency Department.  Take all medication as prescribed.   Avoid smoked meats which are high in sodium content.  Avoid soda which contains sodium and are high in sugar which increases your risk for diabetes. 

## 2019-10-30 ENCOUNTER — Other Ambulatory Visit: Payer: Self-pay

## 2019-10-30 ENCOUNTER — Ambulatory Visit (INDEPENDENT_AMBULATORY_CARE_PROVIDER_SITE_OTHER): Payer: Self-pay | Admitting: Primary Care

## 2019-10-30 ENCOUNTER — Encounter (INDEPENDENT_AMBULATORY_CARE_PROVIDER_SITE_OTHER): Payer: Self-pay | Admitting: Primary Care

## 2019-10-30 DIAGNOSIS — I10 Essential (primary) hypertension: Secondary | ICD-10-CM

## 2019-10-30 DIAGNOSIS — M5489 Other dorsalgia: Secondary | ICD-10-CM

## 2019-10-30 MED ORDER — DICLOFENAC SODIUM 1 % EX GEL: 4.0000 g | Freq: Four times a day (QID) | CUTANEOUS | 1 refills | Status: DC

## 2019-10-30 MED ORDER — CLONIDINE HCL 0.1 MG PO TABS: 0.1000 mg | ORAL_TABLET | Freq: Two times a day (BID) | ORAL | 1 refills | Status: DC

## 2019-10-30 NOTE — Progress Notes (Signed)
BP reading at 8:50 am in the left arm 137/91

## 2019-10-30 NOTE — Progress Notes (Signed)
Virtual Visit via Telephone Note  I connected with Allison Pugh on 10/30/19 at  8:50 AM EST by telephone and verified that I am speaking with the correct person using two identifiers.   I discussed the limitations, risks, security and privacy concerns of performing an evaluation and management service by telephone and the availability of in person appointments. I also discussed with the patient that there may be a patient responsible charge related to this service. The patient expressed understanding and agreed to proceed.   History of Present Illness: [name] is having a tele visit for for blood pressure follow up her systolic ranges 217- 139 and  diastolic ranges 79-84 stopped Bp medication because her face broke out and whole body whelped up from amlodipine . Back pain for 3 weeks feels pulled a muscle .  No past medical history on file.  Current Outpatient Medications on File Prior to Visit  Medication Sig Dispense Refill  . acetaminophen (TYLENOL) 500 MG tablet Take 500 mg by mouth every 6 (six) hours as needed for mild pain.    Marland Kitchen loratadine (CLARITIN) 10 MG tablet Take 1 tablet (10 mg total) by mouth daily. (Patient not taking: Reported on 10/30/2019) 30 tablet 1   No current facility-administered medications on file prior to visit.    Observations/Objective: Review of Systems  Musculoskeletal: Positive for back pain.  All other systems reviewed and are negative.  Assessment and Plan: [name] was seen today for blood pressure check.  Diagnoses and all orders for this visit:  Hypertension, unspecified type Counseled on blood pressure goal of less than 130/80, low-sodium, DASH diet, medication compliance, 150 minutes of moderate intensity exercise per week. Discussed medication compliance, adverse effects.  Other back pain, unspecified chronicity BACK PAIN  Location: bilateral low back pain  Quality: throbbing migrates to tail bone Onset: {3 weeks ago Worse when  wakes up and with bending and lifting   Better with:  Rest on either side Radiation: to coccyx  area  Trauma: no Best leaning forward:   Red Flags Fecal/urinary incontinence: no Numbness/Weakness: no Fever/chills/sweats: no Night pain:no Unexplained weight loss:no No relief with bedrest: yes   Other orders -     cloNIDine (CATAPRES) 0.1 MG tablet; Take 1 tablet (0.1 mg total) by mouth 2 (two) times daily. If medication causes drowsiness take both pills at bedtime.    Follow Up Instructions:    I discussed the assessment and treatment plan with the patient. The patient was provided an opportunity to ask questions and all were answered. The patient agreed with the plan and demonstrated an understanding of the instructions.   The patient was advised to call back or seek an in-person evaluation if the symptoms worsen or if the condition fails to improve as anticipated.  I provided 10 minutes of non-face-to-face time during this encounter.   Grayce Sessions, NP

## 2019-11-07 ENCOUNTER — Telehealth (INDEPENDENT_AMBULATORY_CARE_PROVIDER_SITE_OTHER): Payer: Self-pay

## 2019-11-07 NOTE — Telephone Encounter (Signed)
Patient states cloNIDine (CATAPRES) 0.1 MG tablet  medication is working. Patient takes one pill in the morning @ 8:30 am and one in the afternoon at 8:30 pm. Patient states that after a few hours after taking the medication her blood pressure gets elevated. Patient will like to know if PCP can increase the dosage or if she can take another pill.   Please advice [phone]

## 2019-11-08 NOTE — Telephone Encounter (Signed)
Patient reports Bp readings on 11/06/19 132/75, 135/81. On 2/11 178/74. 2/12; 155/81, 159/87 and 145/81 all reading were with medication. She also reported that its possible she had not been in an at rest position when taking her blood pressure.

## 2019-11-08 NOTE — Telephone Encounter (Signed)
Sent to PCP ?

## 2019-11-08 NOTE — Telephone Encounter (Signed)
This will be appropriate to increase if I have Bp reading to review. Please call patient for Bp log

## 2019-11-10 ENCOUNTER — Other Ambulatory Visit (INDEPENDENT_AMBULATORY_CARE_PROVIDER_SITE_OTHER): Payer: Self-pay | Admitting: Primary Care

## 2019-11-10 MED ORDER — AMLODIPINE BESYLATE 10 MG PO TABS: 10.0000 mg | ORAL_TABLET | Freq: Every day | ORAL | 3 refills | Status: DC

## 2019-11-10 NOTE — Telephone Encounter (Signed)
Added amlodipine 10mg  daily continue clonodine did not increase causes drowdiness

## 2019-11-12 NOTE — Telephone Encounter (Signed)
Left message asking patient to return call to [facility] at [phone].  

## 2019-11-13 NOTE — Telephone Encounter (Signed)
Left message asking patient to return call to [facility] at [phone].  

## 2019-11-15 NOTE — Telephone Encounter (Signed)
Patient can not take amlodipine due to allergic reaction. Can something else be sent?

## 2019-11-17 ENCOUNTER — Other Ambulatory Visit (INDEPENDENT_AMBULATORY_CARE_PROVIDER_SITE_OTHER): Payer: Self-pay | Admitting: Primary Care

## 2019-11-17 MED ORDER — LOSARTAN POTASSIUM 50 MG PO TABS: 50.0000 mg | ORAL_TABLET | Freq: Every day | ORAL | 3 refills | Status: DC

## 2019-11-17 NOTE — Telephone Encounter (Signed)
Discontinue amlodipine sent in losartan 50mg 

## 2019-11-19 NOTE — Telephone Encounter (Signed)
Patient is unable to take losartan as well. She states it caused her to have bad headaches. She believes clonidine is working fine.

## 2019-11-27 ENCOUNTER — Other Ambulatory Visit: Payer: Self-pay

## 2019-11-27 ENCOUNTER — Ambulatory Visit (INDEPENDENT_AMBULATORY_CARE_PROVIDER_SITE_OTHER): Payer: Self-pay | Admitting: Primary Care

## 2019-11-27 ENCOUNTER — Encounter (INDEPENDENT_AMBULATORY_CARE_PROVIDER_SITE_OTHER): Payer: Self-pay | Admitting: Primary Care

## 2019-11-27 DIAGNOSIS — M5489 Other dorsalgia: Secondary | ICD-10-CM

## 2019-11-27 DIAGNOSIS — I10 Essential (primary) hypertension: Secondary | ICD-10-CM

## 2019-11-27 MED ORDER — CLONIDINE HCL 0.1 MG PO TABS: 0.1000 mg | ORAL_TABLET | Freq: Three times a day (TID) | ORAL | 1 refills | Status: DC

## 2019-11-27 NOTE — Progress Notes (Signed)
Back pain- hurts to bend over  Pt states it feels like she has pulled a muscle  Bp at 9:50; 20 minutes after taking medication 160/70

## 2019-11-27 NOTE — Progress Notes (Signed)
Virtual Visit via Telephone Note  I connected with Allison Pugh on 11/27/19 at  9:50 AM EST by telephone and verified that I am speaking with the correct person using two identifiers.   I discussed the limitations, risks, security and privacy concerns of performing an evaluation and management service by telephone and the availability of in person appointments. I also discussed with the patient that there may be a patient responsible charge related to this service. The patient expressed understanding and agreed to proceed.   History of Present Illness: Allison Pugh is having a tele visit for Bp follow up. She feels the only thing that controls her Bp is clonidine but does makes her drowsy there fore she takes 2 at bedtime. Denies shortness of breath, headaches, chest pain or lower extremity edema. Bp today 160/70 but did not provide a log of systolic and diastolic ranges. She complains of back pain feels she has pulled a muscle pain increased with bending over.  Past medical History   Hypertension  Current Outpatient Medications on File Prior to Visit  Medication Sig Dispense Refill  . acetaminophen (TYLENOL) 500 MG tablet Take 500 mg by mouth every 6 (six) hours as needed for mild pain.    Marland Kitchen diclofenac Sodium (VOLTAREN) 1 % GEL Apply 4 g topically 4 (four) times daily. 350 g 1  . loratadine (CLARITIN) 10 MG tablet Take 1 tablet (10 mg total) by mouth daily. 30 tablet 1   No current facility-administered medications on file prior to visit.   Observations/Objective: Amea was seen today for blood pressure check and back pain.  Diagnoses and all orders for this visit:  Hypertension, unspecified type Uncontrolled increased clonidine .1mg  TID. Encourage to write a log of readings and continue to eat low sodium diet, exercise as tolerated , loosing can contribute to lowering Bp.   Other back pain, unspecified chronicity Continue to use Voltaren gel for muscle strain and back pain.  Work on losing weight to help reduce back pain. May alternate with heat and ice application for pain relief. Other alternatives include massage, acupuncture and water aerobics.  You must stay active and avoid a sedentary lifestyle.  Other orders -     cloNIDine (CATAPRES) 0.1 MG tablet; Take 1 tablet (0.1 mg total) by mouth 3 (three) times daily. If medication causes drowsiness take both pills at bedtime.    Assessment and Plan:   Follow Up Instructions:    I discussed the assessment and treatment plan with the patient. The patient was provided an opportunity to ask questions and all were answered. The patient agreed with the plan and demonstrated an understanding of the instructions.   The patient was advised to call back or seek an in-person evaluation if the symptoms worsen or if the condition fails to improve as anticipated.  I provided 8 minutes of non-face-to-face time during this encounter.   , NP

## 2019-12-25 ENCOUNTER — Other Ambulatory Visit: Payer: Self-pay

## 2019-12-25 ENCOUNTER — Encounter (INDEPENDENT_AMBULATORY_CARE_PROVIDER_SITE_OTHER): Payer: Self-pay | Admitting: Primary Care

## 2019-12-25 ENCOUNTER — Ambulatory Visit (INDEPENDENT_AMBULATORY_CARE_PROVIDER_SITE_OTHER): Payer: Self-pay | Admitting: Primary Care

## 2019-12-25 VITALS — BP 119/75 | HR 81 | Temp 97.3°F | Ht 62.0 in | Wt 233.4 lb

## 2019-12-25 DIAGNOSIS — Z6841 Body Mass Index (BMI) 40.0 and over, adult: Secondary | ICD-10-CM

## 2019-12-25 DIAGNOSIS — I10 Essential (primary) hypertension: Secondary | ICD-10-CM

## 2019-12-25 NOTE — Progress Notes (Signed)
Presents for  hypertension evaluation, on previous visit medication was adjusted to include increased clonidine 0.1mg  three times a day.  Patient reports adherence with medications.  Current Medication List Current Outpatient Medications on File Prior to Visit  Medication Sig Dispense Refill  . acetaminophen (TYLENOL) 500 MG tablet Take 500 mg by mouth every 6 (six) hours as needed for mild pain.    . cloNIDine (CATAPRES) 0.1 MG tablet Take 1 tablet (0.1 mg total) by mouth 3 (three) times daily. If medication causes drowsiness take both pills at bedtime. 270 tablet 1  . diclofenac Sodium (VOLTAREN) 1 % GEL Apply 4 g topically 4 (four) times daily. 350 g 1  . loratadine (CLARITIN) 10 MG tablet Take 1 tablet (10 mg total) by mouth daily. 30 tablet 1   No current facility-administered medications on file prior to visit.   Past Medical History  No past medical history on file. Dietary habits include: increase hydration with water, increased fruits and vegetables and decrease carbs Exercise habits include: walking every other day as weather permits  Family / Social history: mother passed CHF  ASCVD risk factors include- [place] CHA2DS2-VASc Score = 2 The patient's score is based upon: CHF History: No HTN History: Yes Age : < 65 Diabetes History: No Stroke History: No Vascular Disease History: No Gender: Female    ASSESSMENT AND PLAN:  blood pressure is at  goal of less than 130/80, Readings at home with monitor was high compared to our readings today. She needs a new monitor or a larger blood pressure cuff. She is on a  low-sodium, DASH diet, medication compliance, 150 minutes of moderate intensity exercise per week. Discussed medication compliance, adverse effects.   Signed,  Grayce Sessions, NP    12/25/2019 1:56 PM   O:  Physical Exam Constitutional:      Appearance: She is obese.  HENT:     Nose: Nose normal.  Cardiovascular:     Rate and Rhythm: Normal rate and regular  rhythm.  Pulmonary:     Effort: Pulmonary effort is normal.     Breath sounds: Normal breath sounds.  Abdominal:     General: Bowel sounds are normal.  Musculoskeletal:        General: Normal range of motion.     Cervical back: Normal range of motion.  Skin:    General: Skin is warm and dry.  Neurological:     Mental Status: She is alert and oriented to person, place, and time.  Psychiatric:        Mood and Affect: Mood normal.        Behavior: Behavior normal.        Thought Content: Thought content normal.        Judgment: Judgment normal.      Review of Systems  All other systems reviewed and are negative.   Last 3 Office BP readings: BP Readings from Last 3 Encounters:  12/25/19 119/75  09/04/19 134/78  03/06/19 111/75    BMET    Component Value Date/Time   NA 141 02/05/2019 1200   K 4.5 02/05/2019 1200   CL 104 02/05/2019 1200   CO2 23 02/05/2019 1200   GLUCOSE 96 02/05/2019 1200   GLUCOSE 115 (H) 11/25/2013 1015   BUN 19 02/05/2019 1200   CREATININE 0.81 02/05/2019 1200   CALCIUM 10.0 02/05/2019 1200   GFRNONAA 81 02/05/2019 1200   GFRAA 93 02/05/2019 1200    Renal function: CrCl cannot be calculated (Patient's  most recent lab result is older than the maximum 21 days allowed.).  Clinical ASCVD: Yes  The 10-year ASCVD risk score Mikey Bussing DC Jr., et al., 2013) is: 3.6%   Values used to calculate the score:     Age: 67 years     Sex: Female     Is Non-Hispanic African American: Yes     Diabetic: No     Tobacco smoker: No     Systolic Blood Pressure: 654 mmHg     Is BP treated: Yes     HDL Cholesterol: 81 mg/dL     Total Cholesterol: 199 mg/dL   A/P: Hypertension longstanding diagnosed currently on current clonidine 0.1mg  3 times a day  medications. BP Goal 130/80 mmHg. Patient is adherent with current medications.  -Continued clonidine 0.1mg  three times a day  -F/u labs ordered - to schedule CMP, lipid and cbc -Counseled on lifestyle modifications  for blood pressure control including reduced dietary sodium, increased exercise, adequate sleep  Morbid obesity ([place]) Morbid Obesity is >40 indicating an excess in caloric intake or underlining conditions. This may lead to other co-morbidities respiratory problems , diabetes and CVD.  Lifestyle modifications of diet and exercise may reduce obesity.

## 2019-12-25 NOTE — Patient Instructions (Signed)

## 2020-02-17 ENCOUNTER — Ambulatory Visit (INDEPENDENT_AMBULATORY_CARE_PROVIDER_SITE_OTHER): Payer: Self-pay

## 2020-02-25 ENCOUNTER — Ambulatory Visit (INDEPENDENT_AMBULATORY_CARE_PROVIDER_SITE_OTHER): Payer: 59

## 2020-02-25 ENCOUNTER — Other Ambulatory Visit: Payer: Self-pay

## 2020-02-25 DIAGNOSIS — Z111 Encounter for screening for respiratory tuberculosis: Secondary | ICD-10-CM | POA: Diagnosis not present

## 2020-02-28 ENCOUNTER — Ambulatory Visit (INDEPENDENT_AMBULATORY_CARE_PROVIDER_SITE_OTHER): Payer: 59

## 2020-02-28 ENCOUNTER — Other Ambulatory Visit: Payer: Self-pay

## 2020-02-28 DIAGNOSIS — Z111 Encounter for screening for respiratory tuberculosis: Secondary | ICD-10-CM

## 2020-02-28 LAB — TB SKIN TEST
Induration: 0 mm
TB Skin Test: NEGATIVE

## 2020-03-25 ENCOUNTER — Ambulatory Visit (INDEPENDENT_AMBULATORY_CARE_PROVIDER_SITE_OTHER): Payer: 59 | Admitting: Primary Care

## 2020-03-25 ENCOUNTER — Other Ambulatory Visit: Payer: Self-pay

## 2020-03-25 ENCOUNTER — Encounter (INDEPENDENT_AMBULATORY_CARE_PROVIDER_SITE_OTHER): Payer: Self-pay | Admitting: Primary Care

## 2020-03-25 VITALS — BP 135/79 | HR 62 | Temp 98.0°F | Ht 62.0 in | Wt 231.2 lb

## 2020-03-25 DIAGNOSIS — I10 Essential (primary) hypertension: Secondary | ICD-10-CM | POA: Diagnosis not present

## 2020-03-25 MED ORDER — CLONIDINE HCL 0.1 MG PO TABS: 0.1000 mg | ORAL_TABLET | Freq: Two times a day (BID) | ORAL | 1 refills | Status: DC

## 2020-03-25 MED ORDER — CLONIDINE HCL 0.1 MG PO TABS: 0.1000 mg | ORAL_TABLET | Freq: Three times a day (TID) | ORAL | 1 refills | Status: DC

## 2020-03-25 NOTE — Patient Instructions (Signed)

## 2020-03-25 NOTE — Progress Notes (Signed)
[facility]    Presents for  hypertension evaluation, on clonidine .01 three times a day  Patient reports adherence with medications. Previously was taking 3 times a day but caused drowsiness and now takes her Bp medication twice daily  Current Medication List Clonidine 0.1mg  twice times a day  Past Medical History  Hypertension  Morbid obesity   Dietary habits include: not healthy  Exercise habits include: "little bit" Family / Social history: no  ASCVD risk factors include- [place]  O:  Physical Exam Vitals reviewed.  Constitutional:      Appearance: She is obese.  HENT:     Head: Normocephalic.     Right Ear: Tympanic membrane normal.     Left Ear: Tympanic membrane normal.  Eyes:     Extraocular Movements: Extraocular movements intact.     Pupils: Pupils are equal, round, and reactive to light.  Cardiovascular:     Rate and Rhythm: Normal rate and regular rhythm.     Pulses: Normal pulses.     Heart sounds: Normal heart sounds.  Pulmonary:     Effort: Pulmonary effort is normal.     Breath sounds: Normal breath sounds.  Abdominal:     General: Bowel sounds are normal.  Musculoskeletal:        General: Normal range of motion.     Cervical back: Normal range of motion and neck supple.  Skin:    General: Skin is warm and dry.     Findings: Rash present.     Comments: Groin area   Neurological:     Mental Status: She is alert and oriented to person, place, and time.  Psychiatric:        Mood and Affect: Mood normal.        Behavior: Behavior normal.        Thought Content: Thought content normal.        Judgment: Judgment normal.      Review of Systems  Skin: Positive for rash.       Groin area cleared with OTC Lotrisone   All other systems reviewed and are negative.   Last 3 Office BP readings: BP Readings from Last 3 Encounters:  03/25/20 135/79  12/25/19 119/75  09/04/19 134/78    BMET    Component Value Date/Time   NA 141  02/05/2019 1200   K 4.5 02/05/2019 1200   CL 104 02/05/2019 1200   CO2 23 02/05/2019 1200   GLUCOSE 96 02/05/2019 1200   GLUCOSE 115 (H) 11/25/2013 1015   BUN 19 02/05/2019 1200   CREATININE 0.81 02/05/2019 1200   CALCIUM 10.0 02/05/2019 1200   GFRNONAA 81 02/05/2019 1200   GFRAA 93 02/05/2019 1200    Renal function: CrCl cannot be calculated (Patient's most recent lab result is older than the maximum 21 days allowed.).  Clinical ASCVD: Yes  The 10-year ASCVD risk score Denman George DC Jr., et al., 2013) is: 5.3%   Values used to calculate the score:     Age: 59 years     Sex: Female     Is Non-Hispanic African American: Yes     Diabetic: No     Tobacco smoker: No     Systolic Blood Pressure: 135 mmHg     Is BP treated: Yes     HDL Cholesterol: 81 mg/dL     Total Cholesterol: 199 mg/dL   A/P: Hypertension longstanding Clonidine .01mg  twice daily on current medications. BP Goal = 130/14mmHg. Patient is adherent  with current medications.  -Decreased dose of clonidine 0.1 twice daily  -F/u labs ordered - no  -Counseled on lifestyle modifications for blood pressure control including reduced dietary sodium, increased exercise, adequate sleep  Grayce Sessions

## 2020-04-07 ENCOUNTER — Ambulatory Visit (INDEPENDENT_AMBULATORY_CARE_PROVIDER_SITE_OTHER): Payer: 59 | Admitting: Primary Care

## 2020-04-08 ENCOUNTER — Other Ambulatory Visit: Payer: Self-pay

## 2020-04-08 ENCOUNTER — Ambulatory Visit (INDEPENDENT_AMBULATORY_CARE_PROVIDER_SITE_OTHER): Payer: 59 | Admitting: Primary Care

## 2020-04-08 ENCOUNTER — Encounter (INDEPENDENT_AMBULATORY_CARE_PROVIDER_SITE_OTHER): Payer: Self-pay | Admitting: Primary Care

## 2020-04-08 ENCOUNTER — Other Ambulatory Visit (HOSPITAL_COMMUNITY)
Admission: RE | Admit: 2020-04-08 | Discharge: 2020-04-08 | Disposition: A | Discharge status: Home / Self Care | Payer: 59 | Source: Ambulatory Visit | Attending: Primary Care | Admitting: Primary Care

## 2020-04-08 VITALS — BP 131/76 | HR 58 | Temp 98.3°F | Ht 62.0 in | Wt 227.0 lb

## 2020-04-08 DIAGNOSIS — Z1239 Encounter for other screening for malignant neoplasm of breast: Secondary | ICD-10-CM | POA: Diagnosis not present

## 2020-04-08 DIAGNOSIS — Z113 Encounter for screening for infections with a predominantly sexual mode of transmission: Secondary | ICD-10-CM | POA: Insufficient documentation

## 2020-04-08 DIAGNOSIS — Z124 Encounter for screening for malignant neoplasm of cervix: Secondary | ICD-10-CM | POA: Insufficient documentation

## 2020-04-08 NOTE — Patient Instructions (Signed)

## 2020-04-08 NOTE — Progress Notes (Signed)
Subjective:     Allison Pugh is a 59 y.o. female and is here for a comprehensive physical exam. The patient reports folliculitus in pubic area and candia on thighs improving with keeping them dry and using cornstarch..  Social History   Socioeconomic History  . Marital status: Single    Spouse name: Not on file  . Number of children: 5  . Years of education: college  . Highest education level: Not on file  Occupational History  . Occupation: CNA  Tobacco Use  . Smoking status: Never Smoker  . Smokeless tobacco: Never Used  Vaping Use  . Vaping Use: Never used  Substance and Sexual Activity  . Alcohol use: No  . Drug use: Yes    Frequency: 7.0 times per week    Types: Marijuana  . Sexual activity: Not on file  Other Topics Concern  . Not on file  Social History Narrative  . Not on file   Social Determinants of Health   Financial Resource Strain:   . Difficulty of Paying Living Expenses:   Food Insecurity:   . Worried About Programme researcher, broadcasting/film/video in the Last Year:   . Barista in the Last Year:   Transportation Needs:   . Freight forwarder (Medical):   Marland Kitchen Lack of Transportation (Non-Medical):   Physical Activity:   . Days of Exercise per Week:   . Minutes of Exercise per Session:   Stress:   . Feeling of Stress :   Social Connections:   . Frequency of Communication with Friends and Family:   . Frequency of Social Gatherings with Friends and Family:   . Attends Religious Services:   . Active Member of Clubs or Organizations:   . Attends Banker Meetings:   Marland Kitchen Marital Status:   Intimate Partner Violence:   . Fear of Current or Ex-Partner:   . Emotionally Abused:   Marland Kitchen Physically Abused:   . Sexually Abused:     Review of Systems Review of Systems  Skin:       Boils in pubic area  All other systems reviewed and are negative.   Objective:  BP 131/76 (BP Location: Right Arm, Patient Position: Sitting, Cuff Size: Large)   Pulse (!) 58    Temp 98.3 F (36.8 C) (Oral)   Ht 5\' 2"  (1.575 m)   Wt 227 lb (103 kg)   LMP 11/25/2013   SpO2 100%   BMI 41.52 kg/m     Assessment:  CONSTITUTIONAL: Well-developed, well-nourished obese female in no acute distress.  HENT:  Normocephalic, atraumatic, External right and left ear normal. EYES: Conjunctivae and EOM are normal. Pupils are equal, round, and reactive to light. No scleral icterus.  NECK: Normal range of motion, supple, no masses.  Normal thyroid.  SKIN: Skin is warm and dry. No rash noted. Not diaphoretic. No erythema. No pallor. NEUROLGIC: Alert and oriented to person, place, and time. Normal reflexes, muscle tone coordination. No cranial nerve deficit noted. PSYCHIATRIC: Normal mood and affect. Normal behavior. Normal judgment and thought content. CARDIOVASCULAR: Normal heart rate noted, regular rhythm RESPIRATORY: Clear to auscultation bilaterally. Effort and breath sounds normal, no problems with respiration noted. BREASTS: Taught SBE ABDOMEN: Soft, distended  normal bowel sounds, no distention noted.  No tenderness, rebound or guarding.  PELVIC: Normal appearing external genitalia; normal appearing vaginal mucosa and cervix.  No abnormal discharge noted.  Pap smear obtained.  Normal uterine size, no other palpable masses, no  uterine or adnexal tenderness. MUSCULOSKELETAL: Normal range of motion. No tenderness.  No cyanosis, clubbing, or edema.  Plan:  Allison Pugh was seen today for gynecologic exam.  Diagnoses and all orders for this visit:  Cervical cancer screening -     Cytology - PAP([place])  Screen for STD (sexually transmitted disease) -     Cervicovaginal ancillary only  Encounter for breast cancer screening using non-mammogram modality Mammogram are recommended for women age 69.   -     MM Digital Screening; Future   See After Visit Summary for Counseling Recommendations

## 2020-04-09 LAB — CERVICOVAGINAL ANCILLARY ONLY
Bacterial Vaginitis (gardnerella): NEGATIVE
Candida Glabrata: NEGATIVE
Candida Vaginitis: NEGATIVE
Chlamydia: NEGATIVE
Comment: NEGATIVE
Comment: NEGATIVE
Comment: NEGATIVE
Comment: NEGATIVE
Comment: NEGATIVE
Comment: NORMAL
Neisseria Gonorrhea: NEGATIVE
Trichomonas: NEGATIVE

## 2020-04-10 ENCOUNTER — Telehealth (INDEPENDENT_AMBULATORY_CARE_PROVIDER_SITE_OTHER): Payer: Self-pay

## 2020-04-10 LAB — CYTOLOGY - PAP
Adequacy: ABSENT
Diagnosis: NEGATIVE

## 2020-04-10 NOTE — Telephone Encounter (Signed)
Patient aware that pap is normal, STD screening negative. She verbalized understanding. Maryjean Morn, CMA

## 2020-04-10 NOTE — Telephone Encounter (Signed)
-----   Message from Grayce Sessions, NP sent at 04/10/2020  1:24 PM EDT ----- Cervicovaginal ancillary negative results everything is normal no STD , yeast, BV

## 2020-04-23 ENCOUNTER — Ambulatory Visit
Admission: RE | Admit: 2020-04-23 | Discharge: 2020-04-23 | Disposition: A | Discharge status: Home / Self Care | Payer: 59 | Source: Ambulatory Visit | Attending: Primary Care | Admitting: Primary Care

## 2020-04-23 DIAGNOSIS — Z1239 Encounter for other screening for malignant neoplasm of breast: Secondary | ICD-10-CM

## 2020-10-20 ENCOUNTER — Other Ambulatory Visit (INDEPENDENT_AMBULATORY_CARE_PROVIDER_SITE_OTHER): Payer: Self-pay | Admitting: Primary Care

## 2020-11-04 ENCOUNTER — Other Ambulatory Visit: Payer: Self-pay

## 2020-11-04 ENCOUNTER — Encounter (INDEPENDENT_AMBULATORY_CARE_PROVIDER_SITE_OTHER): Payer: Self-pay | Admitting: Primary Care

## 2020-11-04 ENCOUNTER — Ambulatory Visit (INDEPENDENT_AMBULATORY_CARE_PROVIDER_SITE_OTHER): Payer: 59 | Admitting: Primary Care

## 2020-11-04 VITALS — BP 120/76 | HR 107 | Temp 97.3°F | Resp 16 | Wt 206.0 lb

## 2020-11-04 DIAGNOSIS — Z760 Encounter for issue of repeat prescription: Secondary | ICD-10-CM | POA: Diagnosis not present

## 2020-11-04 DIAGNOSIS — Z1211 Encounter for screening for malignant neoplasm of colon: Secondary | ICD-10-CM

## 2020-11-04 DIAGNOSIS — M25512 Pain in left shoulder: Secondary | ICD-10-CM

## 2020-11-04 DIAGNOSIS — I10 Essential (primary) hypertension: Secondary | ICD-10-CM | POA: Diagnosis not present

## 2020-11-04 DIAGNOSIS — G8929 Other chronic pain: Secondary | ICD-10-CM

## 2020-11-04 MED ORDER — CLONIDINE HCL 0.1 MG PO TABS: 0.1000 mg | ORAL_TABLET | Freq: Two times a day (BID) | ORAL | 1 refills | Status: DC

## 2020-11-04 NOTE — Addendum Note (Signed)
Addended by: Grayce Sessions on: 11/04/2020 03:51 PM   Modules accepted: Orders

## 2020-11-04 NOTE — Patient Instructions (Signed)
https://www.cancer.gov/types/colorectal/patient/colorectal-prevention-pdq#section/_4">  Preventing Colorectal Cancer Colorectal cancer is a cancerous (malignant) tumor in the colon or rectum, which are parts of the large intestine. A tumor is a mass of cells or tissue. If this cancer is not found early, it can spread to other parts of the body and can be life-threatening. It is one of the most commonly diagnosed types of cancer and a leading cause of death related to cancer. Colorectal cancer cannot always be prevented, but you can take steps to lower your risk of developing it. How can this condition affect me? Most often, colorectal cancer starts as abnormal growths called polyps on the inner wall of the colon or rectum. Left untreated, these polyps can develop into cancer. You can help prevent colorectal cancer by having screening tests to check for these early signs of cancer. During screening, polyps may be removed to be checked for cancer cells and possibly to prevent them from becoming cancerous. If you delay recommended screenings, it is possible that any existing cancer will grow and spread before it is found. Along with regular screenings, making changes to your diet and lifestyle can help prevent colorectal cancer. What can increase my risk? Certain factors may make you more likely to develop this condition. Risk factors that can be changed  Being inactive (sedentary).  Being overweight or obese.  Having a diet that: ? Is high in red meat, precooked or cured meat, or other processed meat, and high in fat (especially animal fat). ? Is low in sources of fiber, such as fruits, vegetables, and whole grains. ? Includes large amounts of sugar-sweetened beverages.  Drinking too much alcohol.  Smoking. Risk factors that cannot be changed  Being older than age 77.  Having a personal or family history of colorectal cancer or polyps in your colon.  Having a type of genetic syndrome that  is passed from parent to child (hereditary), such as: ? Lynch syndrome. ? Familial adenomatous polyposis. ? Turcot syndrome. ? Peutz-Jeghers syndrome. ? MUTYH-associated polyposis (MAP).  Having certain other conditions, such as: ? Inflammatory bowel disease. This includes ulcerative colitis or Crohn's disease. ? Diabetes. ? A history of cancer that was treated with radiation to the abdomen or the area between the hip bones (pelvic area). What actions can I take to prevent this? Nutrition  Eat plenty of fruits and vegetables. Try to eat at least: ? 1-2 cups of fruit each day. ? 2-3 cups of vegetables each day.  Eat whole grains, which are grains that have not been processed. They include oats, whole wheat, bulgur, brown rice, quinoa, and millet. You should eat 6-8 oz (171-227 g) of grains each day. Use a kitchen scale to measure these amounts.  Eat less red meat. Instead, choose lean sources of protein, such as beans, tofu, fish, and chicken.  Avoid precooked or cured meat, such as sausages or meat loaves. Avoid frying and cooking meat at high heat. Use other methods of cooking, such as steaming or sauting.   Lifestyle  Do not use any products that contain nicotine or tobacco, such as cigarettes, e-cigarettes, and chewing tobacco. If you need help quitting, ask your health care provider.  If you drink alcohol: ? Limit how much you use to:  0-1 drink a day for women who are not pregnant.  0-2 drinks a day for men. ? Be aware of how much alcohol is in your drink. In the [place] one drink equals one 12 oz bottle of beer (355 mL), one 5  oz glass of wine (148 mL), or one 1 oz glass of hard liquor (44 mL).  If you are overweight or obese, work with your health care provider to lose weight.  Exercise. Each week, aim for at least 150 minutes of moderate exercise (such as walking, biking, or yoga) or 75 minutes of vigorous exercise (such as running or swimming). Ask your health care  provider how much physical activity is best for you. Getting screened Have regular screenings as often as recommended by your health care provider.  All adults should have screenings starting at age 77 and continuing until age 44. Your health care provider may recommend screening before age 85. You will have tests every 1-10 years, depending on your results and the type of screening test. People at increased risk should start screening at an earlier age.  There are several types of screening tests, such as: ? Tests to check a stool (feces) sample for blood or for DNA changes that could lead to cancer. ? Sigmoidoscopy. During this test, a flexible tube with a tiny camera (sigmoidoscope) is inserted through the opening between the buttocks (anus) and is used to examine the rectum and lower colon. ? Colonoscopy. During this test, a long, thin, flexible tube with a tiny camera (colonoscope) is used to examine the entire colon and rectum. ? Virtual colonoscopy. Instead of a colonoscope, this type of colonoscopy uses a CT scan and computers to take pictures of the colon and rectum. General information  Ask your health care provider if you should take low-dose aspirin to help prevent polyps.  Find out about your family's medical history. It is important to know whether colorectal cancer is in your family. If you have a family history of colorectal cancer, ask to meet with a genetic counselor. Where to find support  Talk with your health care provider about screenings, a healthy diet, and proper exercise.  If you need help to quit smoking or using tobacco, talk to your health care provider or visit these websites: ? [place]: smokefree.gov ? [place]. Department of Health and Human Services: TalkAbuse.com.cy Where to find more information  American Cancer Society: cancer.org  National Cancer Institute: cancer.gov Contact a health care provider if you have:  Blood in your stool or in the  toilet after a bowel movement.  Discomfort, pain, bloating, fullness, or cramps in your abdomen.  A change in your bowel habits.  Unexplained weight loss.  Nausea and vomiting.  Constant tiredness (fatigue). Summary  You can reduce your chances of getting colorectal cancer by getting recommended screenings and making certain diet and lifestyle changes.  Eat plenty of fruits, vegetables, and whole grains. Avoid red meats and processed meats.  Do not use any products that contain nicotine or tobacco, such as cigarettes, e-cigarettes, and chewing tobacco.  Aim for at least 150 minutes of moderate exercise or 75 minutes of vigorous exercise each week. Ask your health care provider how much activity is best for you.  Get regular screenings as often as recommended by your health care provider. Most people should start having colonoscopies at age 31. This information is not intended to replace advice given to you by your health care provider. Make sure you discuss any questions you have with your health care provider. Document Revised: 01/01/2020 Document Reviewed: 01/01/2020 Elsevier Patient Education  2021 [employer].

## 2020-11-04 NOTE — Progress Notes (Addendum)
Follow- up BP   Concerns about left shoulder discomfort   Established Patient Office Visit  Subjective:  Patient ID: Allison Pugh, female    DOB: Jan 11, 1961  Age: 60 y.o. MRN: [id number]  CC:  Chief Complaint  Patient presents with  . Hypertension    HPI [name] is a 60 year old female who presents for HTN management - Denies shortness of breath, headaches, chest pain or lower extremity edema, sudden onset, vision changes, unilateral weakness, dizziness, paresthesias. She did have constipation relieved by sennokot S- now taking prn. She complains of right should pain and difficulty raising and reach with this arm - states she has had rotator cuff surgery on it years ago.  History reviewed. No pertinent past medical history.  Past Surgical History:  Procedure Laterality Date  . DILATION AND CURETTAGE OF UTERUS      History reviewed. No pertinent family history.  Social History   Socioeconomic History  . Marital status: Single    Spouse name: Not on file  . Number of children: 5  . Years of education: college  . Highest education level: Not on file  Occupational History  . Occupation: CNA  Tobacco Use  . Smoking status: Never Smoker  . Smokeless tobacco: Never Used  Vaping Use  . Vaping Use: Never used  Substance and Sexual Activity  . Alcohol use: No  . Drug use: Yes    Frequency: 7.0 times per week    Types: Marijuana  . Sexual activity: Not on file  Other Topics Concern  . Not on file  Social History Narrative  . Not on file   Social Determinants of Health   Financial Resource Strain: Not on file  Food Insecurity: Not on file  Transportation Needs: Not on file  Physical Activity: Not on file  Stress: Not on file  Social Connections: Not on file  Intimate Partner Violence: Not on file    Outpatient Medications Prior to Visit  Medication Sig Dispense Refill  . acetaminophen (TYLENOL) 500 MG tablet Take 500 mg by mouth every 6 (six) hours  as needed for mild pain.    . cloNIDine (CATAPRES) 0.1 MG tablet Take 1 tablet (0.1 mg total) by mouth 2 (two) times daily. If medication causes drowsiness take both pills at bedtime. 270 tablet 1   No facility-administered medications prior to visit.    Allergies  Allergen Reactions  . Amlodipine Anaphylaxis    Hives, swelling, blisters  . Hydrochlorothiazide     cramps  . Losartan Other (See Comments)    Dizziness, headache    ROS Review of Systems Pertinent positive and negatives listed in HPI   Objective:    Physical Exam Vitals reviewed.  Constitutional:      Appearance: She is obese.  HENT:     Head: Normocephalic.     Right Ear: Tympanic membrane and external ear normal.     Left Ear: Tympanic membrane and external ear normal.     Nose: Nose normal.  Eyes:     Extraocular Movements: Extraocular movements intact.  Cardiovascular:     Rate and Rhythm: Normal rate and regular rhythm.  Pulmonary:     Effort: Pulmonary effort is normal.     Breath sounds: Normal breath sounds.  Abdominal:     General: Bowel sounds are normal. There is distension.     Palpations: Abdomen is soft.  Musculoskeletal:        General: Normal range of motion.  Cervical back: Normal range of motion and neck supple.     Comments: Right arm decrease ROM 45 degrees and begins to hurt   Skin:    General: Skin is warm and dry.  Neurological:     Mental Status: She is alert and oriented to person, place, and time.  Psychiatric:        Mood and Affect: Mood normal.        Behavior: Behavior normal.        Thought Content: Thought content normal.        Judgment: Judgment normal.     BP 120/76   Pulse (!) 107   Temp (!) 97.3 F (36.3 C)   Resp 16   Wt 206 lb (93.4 kg)   LMP 11/25/2013   SpO2 98%   BMI 37.68 kg/m  Wt Readings from Last 3 Encounters:  11/04/20 206 lb (93.4 kg)  04/08/20 227 lb (103 kg)  03/25/20 231 lb 3.2 oz (104.9 kg)     Health Maintenance Due  Topic  Date Due  . COVID-19 Vaccine (1) Never done  . COLONOSCOPY (Pts 45-81yr Insurance coverage will need to be confirmed)  Never done  . INFLUENZA VACCINE  Never done    There are no preventive care reminders to display for this patient.  Lab Results  Component Value Date   TSH 1.190 02/09/2017   Lab Results  Component Value Date   WBC 5.7 02/05/2019   HGB 12.6 02/05/2019   HCT 38.3 02/05/2019   MCV 90 02/05/2019   PLT 359 02/05/2019   Lab Results  Component Value Date   NA 141 02/05/2019   K 4.5 02/05/2019   CO2 23 02/05/2019   GLUCOSE 96 02/05/2019   BUN 19 02/05/2019   CREATININE 0.81 02/05/2019   BILITOT 0.3 02/05/2019   ALKPHOS 148 (H) 02/05/2019   AST 14 02/05/2019   ALT 11 02/05/2019   PROT 7.2 02/05/2019   ALBUMIN 4.4 02/05/2019   CALCIUM 10.0 02/05/2019   Lab Results  Component Value Date   CHOL 199 02/05/2019   Lab Results  Component Value Date   HDL 81 02/05/2019   Lab Results  Component Value Date   LDLCALC 103 (H) 02/05/2019   Lab Results  Component Value Date   TRIG 75 02/05/2019   Lab Results  Component Value Date   CHOLHDL 2.5 02/05/2019   Lab Results  Component Value Date   HGBA1C 5.4 02/05/2019      Assessment & Plan:  Allison Pugh seen today for hypertension.  Diagnoses and all orders for this visit:  Colon cancer screening -     Ambulatory referral to Gastroenterology  Encounter for medication refill -     cloNIDine (CATAPRES) 0.1 MG tablet; Take 1 tablet (0.1 mg total) by mouth 2 (two) times daily. If medication causes drowsiness take both pills at bedtime.  Hypertension, unspecified type Well controlled  goal  Met of  less than 130/80, low-sodium, DASH diet, medication compliance, 150 minutes of moderate intensity exercise per week. Discussed medication compliance, adverse effects. -     CMP14+EGFR -     CBC with Differential/Platelet  Morbid obesity ([place] She is please to announce she is walking and increased her water  intake and decrease unhealthy snacking and has lost 6 lbs. Excited and continue the good work  -     Lipid panel  Chronic left shoulder pain Decrease ROM hx of ORIF  -     Ambulatory  referral to Orthopedic Surgery    Meds ordered this encounter  Medications  . cloNIDine (CATAPRES) 0.1 MG tablet    Sig: Take 1 tablet (0.1 mg total) by mouth 2 (two) times daily. If medication causes drowsiness take both pills at bedtime.    Dispense:  270 tablet    Refill:  1    Follow-up: Return in about 6 months (around 05/04/2021) for Bp f/u .    Kerin Perna, NP

## 2020-11-05 LAB — LIPID PANEL
Chol/HDL Ratio: 2.5 ratio (ref 0.0–4.4)
Cholesterol, Total: 218 mg/dL — ABNORMAL HIGH (ref 100–199)
HDL: 86 mg/dL (ref >39)
LDL Chol Calc (NIH): 121 mg/dL — ABNORMAL HIGH (ref 0–99)
Triglycerides: 65 mg/dL (ref 0–149)
VLDL Cholesterol Cal: 11 mg/dL (ref 5–40)

## 2020-11-05 LAB — CBC WITH DIFFERENTIAL/PLATELET
Basophils Absolute: 0 10*3/uL (ref 0.0–0.2)
Basos: 0 %
EOS (ABSOLUTE): 0.1 10*3/uL (ref 0.0–0.4)
Eos: 1 %
Hematocrit: 38.2 % (ref 34.0–46.6)
Hemoglobin: 12.6 g/dL (ref 11.1–15.9)
Immature Grans (Abs): 0 10*3/uL (ref 0.0–0.1)
Immature Granulocytes: 0 %
Lymphocytes Absolute: 2.6 10*3/uL (ref 0.7–3.1)
Lymphs: 38 %
MCH: 30.2 pg (ref 26.6–33.0)
MCHC: 33 g/dL (ref 31.5–35.7)
MCV: 92 fL (ref 79–97)
Monocytes Absolute: 0.5 10*3/uL (ref 0.1–0.9)
Monocytes: 8 %
Neutrophils Absolute: 3.5 10*3/uL (ref 1.4–7.0)
Neutrophils: 53 %
Platelets: 422 10*3/uL (ref 150–450)
RBC: 4.17 x10E6/uL (ref 3.77–5.28)
RDW: 13.8 % (ref 11.7–15.4)
WBC: 6.7 10*3/uL (ref 3.4–10.8)

## 2020-11-05 LAB — CMP14+EGFR
ALT: 9 IU/L (ref 0–32)
AST: 16 IU/L (ref 0–40)
Albumin/Globulin Ratio: 1.7 (ref 1.2–2.2)
Albumin: 4.7 g/dL (ref 3.8–4.9)
Alkaline Phosphatase: 163 IU/L — ABNORMAL HIGH (ref 44–121)
BUN/Creatinine Ratio: 17 (ref 9–23)
BUN: 15 mg/dL (ref 6–24)
Bilirubin Total: <0.2 mg/dL (ref 0.0–1.2)
CO2: 26 mmol/L (ref 20–29)
Calcium: 10.1 mg/dL (ref 8.7–10.2)
Chloride: 103 mmol/L (ref 96–106)
Creatinine, Ser: 0.87 mg/dL (ref 0.57–1.00)
GFR calc Af Amer: 84 mL/min/{1.73_m2} (ref >59)
GFR calc non Af Amer: 73 mL/min/{1.73_m2} (ref >59)
Globulin, Total: 2.8 g/dL (ref 1.5–4.5)
Glucose: 87 mg/dL (ref 65–99)
Potassium: 4.4 mmol/L (ref 3.5–5.2)
Sodium: 144 mmol/L (ref 134–144)
Total Protein: 7.5 g/dL (ref 6.0–8.5)

## 2020-11-10 ENCOUNTER — Ambulatory Visit (INDEPENDENT_AMBULATORY_CARE_PROVIDER_SITE_OTHER): Payer: 59

## 2020-11-10 ENCOUNTER — Encounter: Payer: Self-pay | Admitting: Orthopaedic Surgery

## 2020-11-10 ENCOUNTER — Ambulatory Visit (INDEPENDENT_AMBULATORY_CARE_PROVIDER_SITE_OTHER): Payer: 59 | Admitting: Orthopaedic Surgery

## 2020-11-10 DIAGNOSIS — M542 Cervicalgia: Secondary | ICD-10-CM

## 2020-11-10 DIAGNOSIS — M25532 Pain in left wrist: Secondary | ICD-10-CM

## 2020-11-10 DIAGNOSIS — M25512 Pain in left shoulder: Secondary | ICD-10-CM | POA: Diagnosis not present

## 2020-11-10 MED ORDER — METHYLPREDNISOLONE ACETATE 40 MG/ML IJ SUSP
40.0000 mg | INTRAMUSCULAR | Status: AC | PRN
Start: 2020-11-10 — End: 2020-11-10
  Administered 2020-11-10: 40 mg via INTRA_ARTICULAR

## 2020-11-10 MED ORDER — BUPIVACAINE HCL 0.25 % IJ SOLN
2.0000 mL | INTRAMUSCULAR | Status: AC | PRN
Start: 2020-11-10 — End: 2020-11-10
  Administered 2020-11-10: 2 mL via INTRA_ARTICULAR

## 2020-11-10 MED ORDER — LIDOCAINE HCL 2 % IJ SOLN
2.0000 mL | INTRAMUSCULAR | Status: AC | PRN
  Administered 2020-11-10: 2 mL

## 2020-11-10 NOTE — Progress Notes (Signed)
Office Visit Note   Patient: Allison Pugh           Date of Birth: 12-04-1960           MRN: [id number] Visit Date: 11/10/2020              Requested by: Grayce Sessions, NP [address] [place],  [place] [address] PCP: Grayce Sessions, NP   Assessment & Plan: Visit Diagnoses:  1. Left shoulder pain, unspecified chronicity   2. Pain in left wrist   3. Neck pain     Plan: Impression is left shoulder rotator cuff tendinitis and underlying cervical spine radiculopathy.  We have discussed various treatment options to include cortisone injection versus MRI.  We will proceed with left shoulder subacromial cortisone injection today.  She will pay special attention during the anesthetic phase.  If her symptoms have not improved over the next few weeks she will call us and we will determine if in which MRI to obtain.  Follow-Up Instructions: Return if symptoms worsen or fail to improve.   Orders:  Orders Placed This Encounter  Procedures  . Large Joint Inj: L subacromial bursa  . XR Cervical Spine 2 or 3 views  . XR Shoulder Left  . XR Wrist Complete Left   No orders of the defined types were placed in this encounter.     Procedures: Large Joint Inj: L subacromial bursa on 11/10/2020 2:52 PM Indications: pain Details: 22 G needle Medications: 2 mL lidocaine 2 %; 2 mL bupivacaine 0.25 %; 40 mg methylPREDNISolone acetate 40 MG/ML Outcome: tolerated well, no immediate complications Patient was prepped and draped in the usual sterile fashion.       Clinical Data: No additional findings.   Subjective: Chief Complaint  Patient presents with  . Right Shoulder - Pain  . Neck - Pain  . 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  This all began after a fall where she braced herself on her left side.  She has had pain to these areas since.  She  notes a knife jabbing sensation throughout the arm specifically to the deltoid.  She has increased pain with the extremes of forward flexion of the shoulder as well as with rotation of the neck to the left.  She has increased pain when she is sleeping on the left side at as well.  She notes numbness at times to her left upper extremity.  No previous neck or shoulder pathology.  Review of Systems as detailed in HPI.  All others reviewed and are negative.   Objective: Vital Signs: LMP 11/25/2013   Physical Exam well-developed well-nourished female no acute distress.  Alert and oriented x3.  Ortho Exam cervical exam shows no spinous tenderness.  Paraspinous tenderness on the left.  Left shoulder exam shows near full active range of motion in all planes although she has pain with the extremes of forward flexion and internal and external rotation.  Positive empty can.  Negative cross body adduction.  She has slight decreased strength with empty can belly press.  She has neurovascular intact distally.  Specialty Comments:  No specialty comments available.  Imaging: XR Wrist Complete Left  Result Date: 11/10/2020 No acute or structural abnormalities  XR Cervical Spine 2 or 3 views  Result Date: 11/10/2020 Multilevel spondylosis with straightening  of the cervical spine  XR Shoulder Left  Result Date: 11/10/2020 No acute or structural abnormalities    PMFS History: Patient Active Problem List   Diagnosis Date Noted  . Essential hypertension 03/10/2017  . BV (bacterial vaginosis) 02/11/2017  . DERMATITIS, ALLERGIC 02/22/2008  . ALLERGIC RHINITIS 07/25/2007  . MYALGIA, HX OF 07/25/2007   History reviewed. No pertinent past medical history.  History reviewed. No pertinent family history.  Past Surgical History:  Procedure Laterality Date  . DILATION AND CURETTAGE OF UTERUS     Social History   Occupational History  . Occupation: CNA  Tobacco Use  . Smoking status: Never Smoker   . Smokeless tobacco: Never Used  Vaping Use  . Vaping Use: Never used  Substance and Sexual Activity  . Alcohol use: No  . Drug use: Yes    Frequency: 7.0 times per week    Types: Marijuana  . Sexual activity: Not on file

## 2020-11-12 ENCOUNTER — Other Ambulatory Visit (INDEPENDENT_AMBULATORY_CARE_PROVIDER_SITE_OTHER): Payer: Self-pay | Admitting: Primary Care

## 2020-11-12 DIAGNOSIS — E782 Mixed hyperlipidemia: Secondary | ICD-10-CM

## 2020-11-12 MED ORDER — PRAVASTATIN SODIUM 20 MG PO TABS: 20.0000 mg | ORAL_TABLET | Freq: Every day | ORAL | 3 refills | Status: DC

## 2020-11-13 ENCOUNTER — Telehealth (INDEPENDENT_AMBULATORY_CARE_PROVIDER_SITE_OTHER): Payer: Self-pay

## 2020-11-13 NOTE — Telephone Encounter (Signed)
-----   Message from Grayce Sessions, NP sent at 11/12/2020  9:27 AM EST ----- Labs are normal with the exception of elevated liver enzymes - this can be from alcohol use in the past or present or too much tylenol. Will monitor . Cholesterol Your LDL is not in range. Your LDL is the bad cholesterol that can lead to heart attack and stroke. To lower your number you can decrease your fatty foods, red meat, cheese, milk and increase fiber like whole grains and veggies.  Sent in pravastatin 20mg  take at bedtime

## 2020-11-13 NOTE — Telephone Encounter (Signed)
Patient is aware of results per PCP.

## 2020-11-24 ENCOUNTER — Telehealth: Payer: Self-pay | Admitting: Orthopaedic Surgery

## 2020-11-24 ENCOUNTER — Other Ambulatory Visit: Payer: Self-pay | Admitting: Physician Assistant

## 2020-11-24 MED ORDER — HYDROCODONE-ACETAMINOPHEN 5-325 MG PO TABS: 1.0000 | ORAL_TABLET | Freq: Three times a day (TID) | ORAL | 0 refills | Status: DC | PRN

## 2020-11-24 NOTE — Telephone Encounter (Signed)
I sent in norco.  Can you get MRI cervical spine and left shoulder

## 2020-11-24 NOTE — Telephone Encounter (Signed)
See message.

## 2020-11-24 NOTE — Telephone Encounter (Signed)
Patient called asked if she can be set up to have an MRI? Patient said she would like something for pain. The Tramadol made her sick on her stomach. The number to contact patient is [phone]

## 2020-11-25 ENCOUNTER — Other Ambulatory Visit: Payer: Self-pay

## 2020-11-25 DIAGNOSIS — M25512 Pain in left shoulder: Secondary | ICD-10-CM

## 2020-11-25 DIAGNOSIS — M542 Cervicalgia: Secondary | ICD-10-CM

## 2020-11-25 NOTE — Telephone Encounter (Signed)
Patient aware.

## 2020-12-02 ENCOUNTER — Ambulatory Visit (INDEPENDENT_AMBULATORY_CARE_PROVIDER_SITE_OTHER): Payer: Self-pay | Admitting: *Deleted

## 2020-12-02 ENCOUNTER — Telehealth: Payer: Self-pay | Admitting: Orthopaedic Surgery

## 2020-12-02 NOTE — Telephone Encounter (Signed)
Then the itching and hives are not from the hydrocodone if she hasn't taken it since Sunday.  It's all out of her system in less than 24 hours.  She should see her PCP or urgent care.  Thanks.

## 2020-12-02 NOTE — Telephone Encounter (Signed)
She should use benadryl 25-50 mg to start with.

## 2020-12-02 NOTE — Telephone Encounter (Signed)
Called patient to advise. States she's been doing that since Thursday. Doesn't help.

## 2020-12-02 NOTE — Telephone Encounter (Signed)
Patient aware.

## 2020-12-02 NOTE — Telephone Encounter (Signed)
See msg below

## 2020-12-02 NOTE — Telephone Encounter (Signed)
She states she Stopped taking it Sunday night.

## 2020-12-02 NOTE — Telephone Encounter (Signed)
Pt called stating she is having an allergic reaction to the hydrocodone; she would like something called in for the itching and the hives. Pt also mentioned her eyes are swelling. She would like a CB when the rx has been called in.  [phone]

## 2020-12-02 NOTE — Telephone Encounter (Signed)
C/o allergic reaction to taking prescribed hydrocodone from Dr. Roda Shutters last week. C/o itching and "hives" continuing on face with swelling, neck, and chest. Patient reports she took hydrocodone Wednesday -Saturday and started having a rash and hives with blisters, whelps to face, neck and chest and throat "closing" starting on Saturday and Sunday. Patient stopped taking hydrocodone and took dymatap liquid and felt a little better. Patient has been taking ibuprofen for pain and has taken tylenol but stopped due to elevated liver enzymes and is not supposed to take Tylenol. Patient has also tried hydrocortisone cream to hives and "cortaid" and her skin feels like fire after cream is applied. Patient reports she was also given a cortisone shot in the back of her shoulder that did not help. Patient called Dr. Roda Shutters and was instructed to call PCP. Due to multiple intolerances to medications, please advise what patient can take for itching and swelling in face and hives. Care advise given. Patient verbalized understanding of care advise and to call back or go to [facility] or ED if symptoms worsen. Denies difficulty breathing and throat closing at this time. Please advise . No appt available until 12/10/20.  Reason for Disposition . [1] Took antihistamine (e.g., Benadryl) by mouth AND [2] no symptoms now  Answer Assessment - Initial Assessment Questions 1. MAIN SYMPTOM: "What is your main symptom?" "How bad is it?"       Itching and rash or hives on face ,chest, neck  2. RESPIRATORY STATUS: "Are you having difficulty breathing?"  (e.g., yes/no, wheezing, unable to complete a sentence)      Not  Now. Difficulty breathing on Saturday and Sunday  3. SWALLOWING: "Can you swallow?" (e.g., yes/no, food, fluid, saliva)      yes 4. VASCULAR STATUS: "Are you feeling weak?" If Yes, ask: "Can you stand and walk normally?"     na 5. ONSET: "When did the reaction start?" (Minutes or hours ago)      Started Saturday in to Sunday 6.  SUSTANCE: "What are you reacting to?" "When did the contact occur?"      Hydrocodone she thinks 7. PREVIOUS REACTION: "Have you ever reacted to it before?" If Yes, ask: "What happened that time?"     Yes . Has reacted to amlodipine 8. EPINEPHRINE: "Do you have an epinephrine autoinjector (e.g., EpiPen)?"     na  Protocols used: ANAPHYLAXIS-A-AH

## 2020-12-02 NOTE — Telephone Encounter (Signed)
Is she still taking the hydrocodone?

## 2020-12-20 ENCOUNTER — Other Ambulatory Visit: Payer: 59

## 2021-01-01 ENCOUNTER — Ambulatory Visit
Admission: RE | Admit: 2021-01-01 | Discharge: 2021-01-01 | Disposition: A | Discharge status: Home / Self Care | Payer: 59 | Source: Ambulatory Visit | Attending: Orthopaedic Surgery | Admitting: Orthopaedic Surgery

## 2021-01-01 ENCOUNTER — Other Ambulatory Visit: Payer: Self-pay

## 2021-01-01 DIAGNOSIS — M542 Cervicalgia: Secondary | ICD-10-CM

## 2021-01-01 DIAGNOSIS — M25512 Pain in left shoulder: Secondary | ICD-10-CM

## 2021-01-06 ENCOUNTER — Other Ambulatory Visit: Payer: Self-pay

## 2021-01-06 ENCOUNTER — Ambulatory Visit (INDEPENDENT_AMBULATORY_CARE_PROVIDER_SITE_OTHER): Payer: 59 | Admitting: Orthopaedic Surgery

## 2021-01-06 ENCOUNTER — Encounter: Payer: Self-pay | Admitting: Orthopaedic Surgery

## 2021-01-06 DIAGNOSIS — S46012A Strain of muscle(s) and tendon(s) of the rotator cuff of left shoulder, initial encounter: Secondary | ICD-10-CM | POA: Diagnosis not present

## 2021-01-06 NOTE — Progress Notes (Signed)
Office Visit Note   Patient: Allison Pugh           Date of Birth: 1961-09-01           MRN: [id number] Visit Date: 01/06/2021              Requested by: Grayce Sessions, NP [address] [place],  [place] [address] PCP: Grayce Sessions, NP   Assessment & Plan: Visit Diagnoses:  1. Traumatic complete tear of left rotator cuff, initial encounter     Plan: Impression is left full-thickness supraspinatus tendon tear with moderate infraspinatus and biceps tendinopathy as well as mild subscapularis tendinosis.  Also noted on the cervical spine MRI is a Chiari I malformation without any stenotic findings to explain her shoulder pain.  We will make a referral to neurosurgery for the incidental finding of the chiari malformation.  In regards to her left shoulder, we have recommended surgical intervention to include rotator cuff repair, extensive debridement including biceps tenotomy, distal clavicle excision, and subacromial decompression.  Risk, benefits and possible complications reviewed, rehab and recovery discussed.  All questions were answered.  Total face to face encounter time was greater than 25 minutes and over half of this time was spent in counseling and/or coordination of care.  Follow-Up Instructions: Return for post-op.   Orders:  No orders of the defined types were placed in this encounter.  No orders of the defined types were placed in this encounter.     Procedures: No procedures performed   Clinical Data: No additional findings.   Subjective: Chief Complaint  Patient presents with  . Left Shoulder - Pain  . Neck - Pain    HPI patient is a very pleasant 60 year old female who comes in today to discuss MRI results of the left shoulder and cervical spine.  She has been dealing with pain and paresthesias to the left upper extremity since a fall which occurred several months ago.  She underwent subacromial cortisone injection with minimal and  temporary relief.  MRI of the cervical spine reveals mild multilevel degenerative changes as well as a Chiari I malformation.  MRI of the left shoulder shows a full-thickness supraspinatus tendon tear with moderate infraspinatus tendinosis and mild subscapularis tendinosis in addition to moderate biceps tendinosis and subacromial bursitis.  Review of Systems as detailed in HPI.  All others reviewed and are negative.   Objective: Vital Signs: LMP 11/25/2013   Physical Exam well-developed well-nourished female in no acute distress.  Alert oriented x3  Ortho Exam.  Left shoulder exam shows forward flexion to about 160 degrees.  She has near full internal and external rotation.  She does have moderately positive empty can with decreased strength secondary to pain.  Negative drop arm test.  She is neurovascular intact distally.  Specialty Comments:  No specialty comments available.  Imaging: No new imaging   PMFS History: Patient Active Problem List   Diagnosis Date Noted  . Essential hypertension 03/10/2017  . BV (bacterial vaginosis) 02/11/2017  . DERMATITIS, ALLERGIC 02/22/2008  . ALLERGIC RHINITIS 07/25/2007  . MYALGIA, HX OF 07/25/2007   History reviewed. No pertinent past medical history.  History reviewed. No pertinent family history.  Past Surgical History:  Procedure Laterality Date  . DILATION AND CURETTAGE OF UTERUS     Social History   Occupational History  . Occupation: CNA  Tobacco Use  . Smoking status: Never Smoker  . Smokeless tobacco: Never Used  Vaping Use  . Vaping Use:  Never used  Substance and Sexual Activity  . Alcohol use: No  . Drug use: Yes    Frequency: 7.0 times per week    Types: Marijuana  . Sexual activity: Not on file

## 2021-01-07 ENCOUNTER — Telehealth: Payer: Self-pay | Admitting: Orthopaedic Surgery

## 2021-01-07 ENCOUNTER — Other Ambulatory Visit: Payer: Self-pay

## 2021-01-07 DIAGNOSIS — G935 Compression of brain: Secondary | ICD-10-CM

## 2021-01-07 NOTE — Telephone Encounter (Signed)
Pt called and is wondering if you can send her muscle relaxer to [facility] on [address].

## 2021-01-11 ENCOUNTER — Telehealth: Payer: Self-pay | Admitting: Orthopaedic Surgery

## 2021-01-11 NOTE — Telephone Encounter (Signed)
Please advise 

## 2021-01-11 NOTE — Telephone Encounter (Signed)
Patient called asking for a muscle relaxer. Please send to pharmacy on file. Patient phone number is [phone]

## 2021-01-12 MED ORDER — CYCLOBENZAPRINE HCL 5 MG PO TABS: 5.0000 mg | ORAL_TABLET | Freq: Three times a day (TID) | ORAL | 3 refills | Status: DC | PRN

## 2021-01-12 NOTE — Telephone Encounter (Signed)
Called and informed.

## 2021-01-12 NOTE — Addendum Note (Signed)
Addended by: Mayra Reel on: 01/12/2021 07:11 AM   Modules accepted: Orders

## 2021-01-12 NOTE — Telephone Encounter (Signed)
done

## 2021-04-12 ENCOUNTER — Other Ambulatory Visit: Payer: Self-pay

## 2021-04-12 ENCOUNTER — Ambulatory Visit (INDEPENDENT_AMBULATORY_CARE_PROVIDER_SITE_OTHER): Payer: 59

## 2021-04-12 DIAGNOSIS — Z111 Encounter for screening for respiratory tuberculosis: Secondary | ICD-10-CM

## 2021-04-14 ENCOUNTER — Other Ambulatory Visit: Payer: Self-pay

## 2021-04-14 ENCOUNTER — Ambulatory Visit (INDEPENDENT_AMBULATORY_CARE_PROVIDER_SITE_OTHER): Payer: 59

## 2021-04-14 DIAGNOSIS — Z111 Encounter for screening for respiratory tuberculosis: Secondary | ICD-10-CM

## 2021-04-14 LAB — TB SKIN TEST
Induration: 0 mm
TB Skin Test: NEGATIVE

## 2021-04-22 IMAGING — MR MR CERVICAL SPINE W/O CM
4 of 5 series · 30 of 48 positions shown · non-contrast
Comparison: Radiographs November 10, 2020.

CLINICAL DATA: Neck pain, chronic.

EXAM:
MRI CERVICAL SPINE WITHOUT CONTRAST
TECHNIQUE: Multiplanar, multisequence MR imaging of the cervical spine was
performed. No intravenous contrast was administered.

[Series 3: T2 · sagittal · 3.0mm · 0.82mm/px · 8 of 19 slices shown (1 of 2)]
[im 1/19]
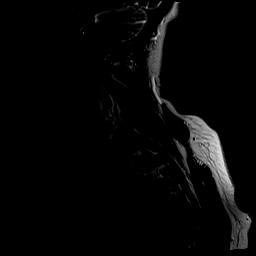
[im 3/19]
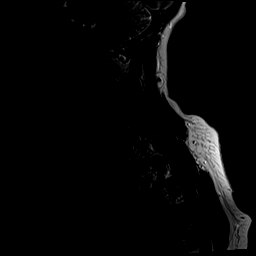
[im 6/19]
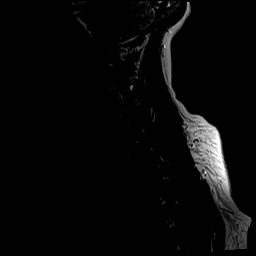
[im 8/19]
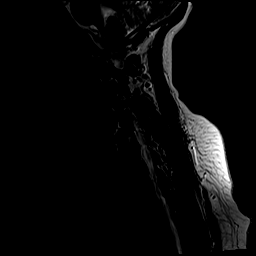
[im 11/19]
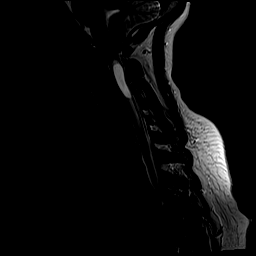
[im 13/19]
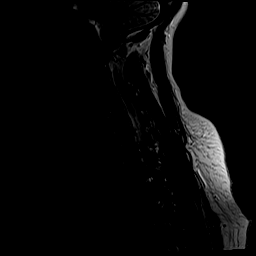
[im 16/19]
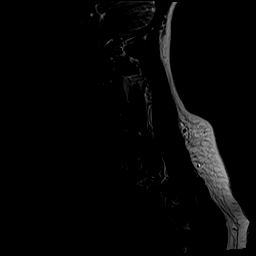
[im 19/19]
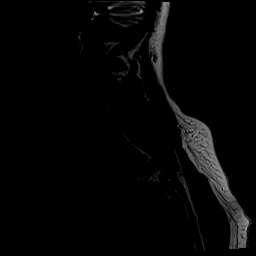

[Series 4: T1 · sagittal · 3.0mm · 0.41mm/px · 7 of 19 slices shown]
[im 1/19]
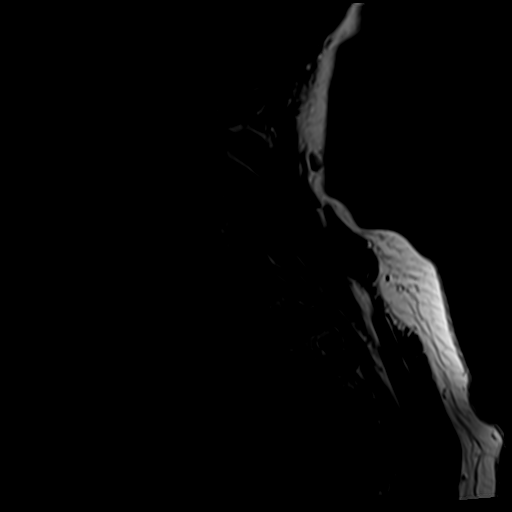
[im 4/19]
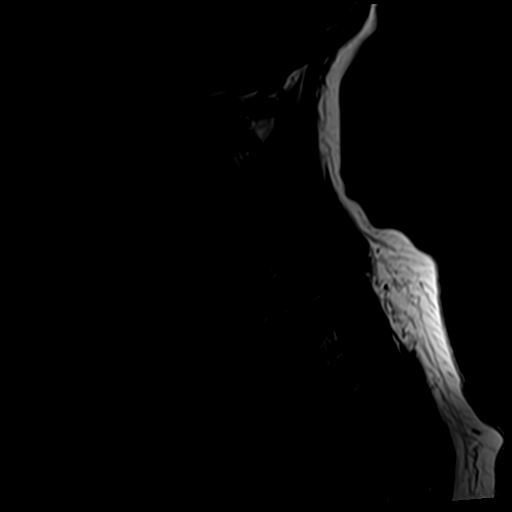
[im 7/19]
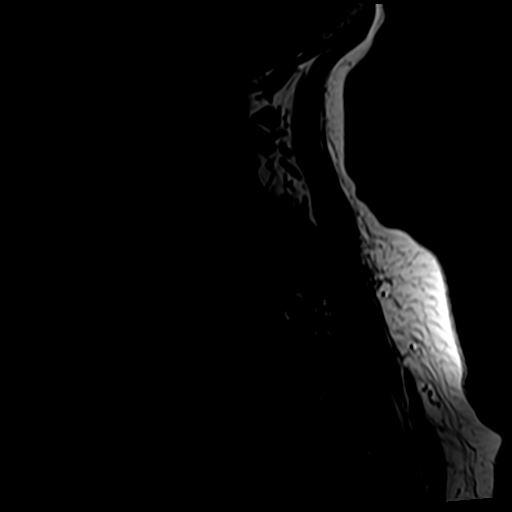
[im 10/19]
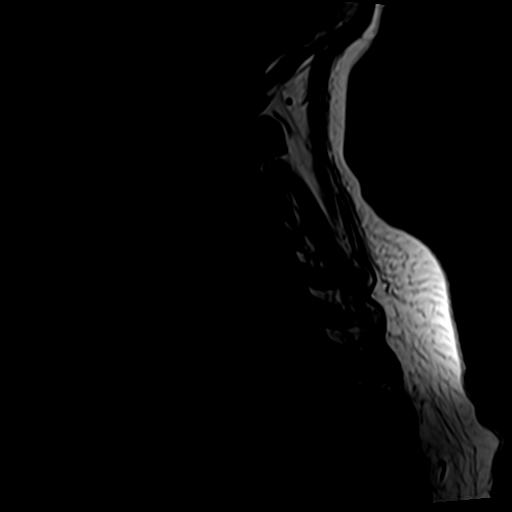
[im 13/19]
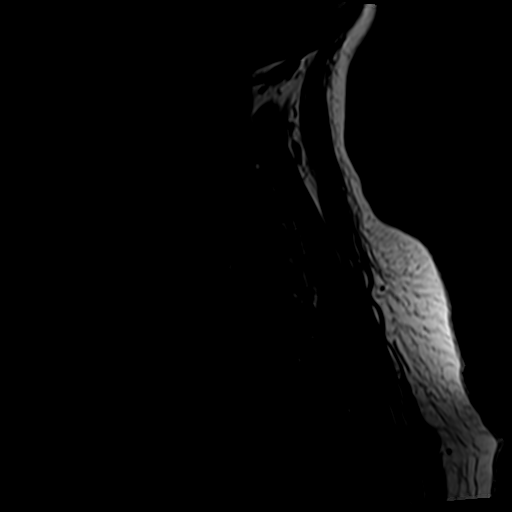
[im 16/19]
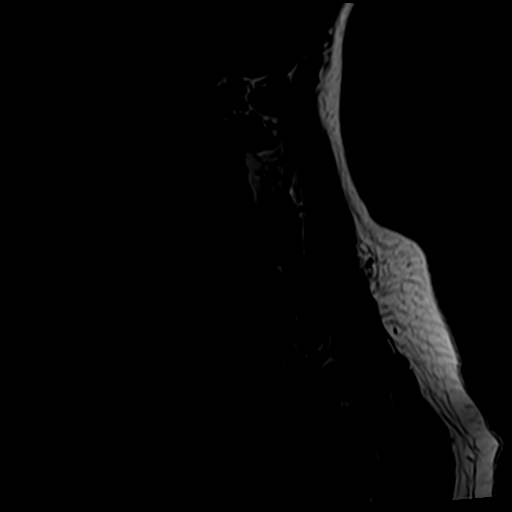
[im 19/19]
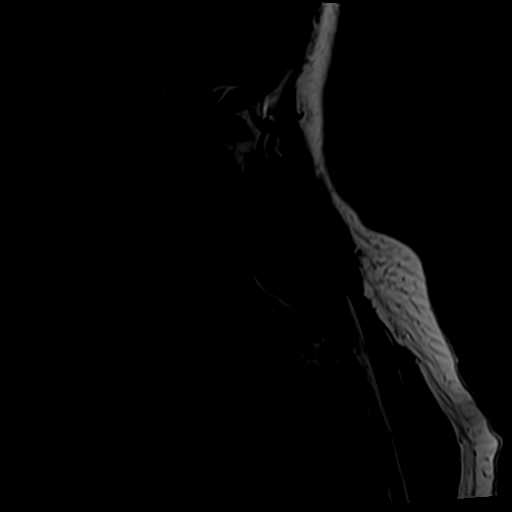

[Series 5: tir sag · sagittal · 3.0mm · 0.43mm/px · 6 of 19 slices shown]
[im 1/19]
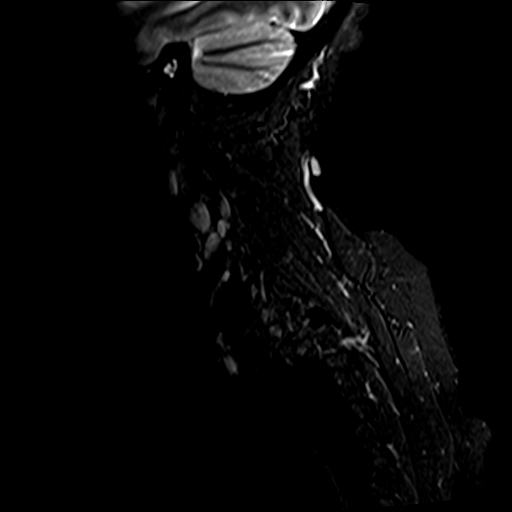
[im 4/19]
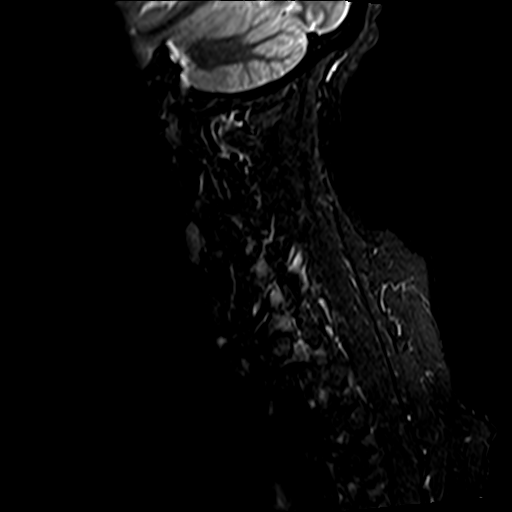
[im 7/19]
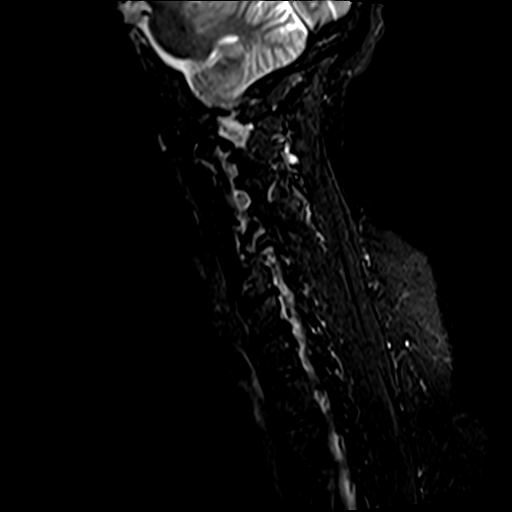
[im 10/19]
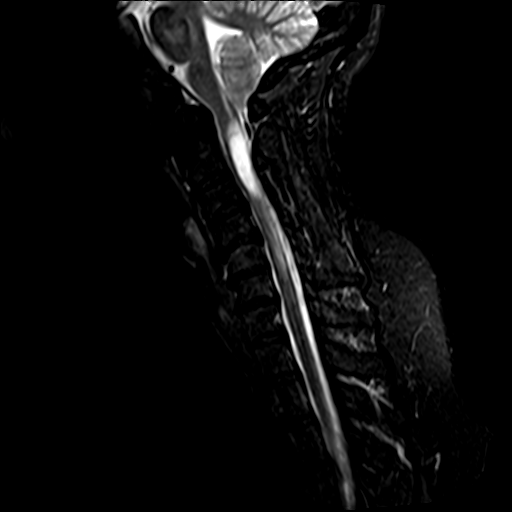
[im 13/19]
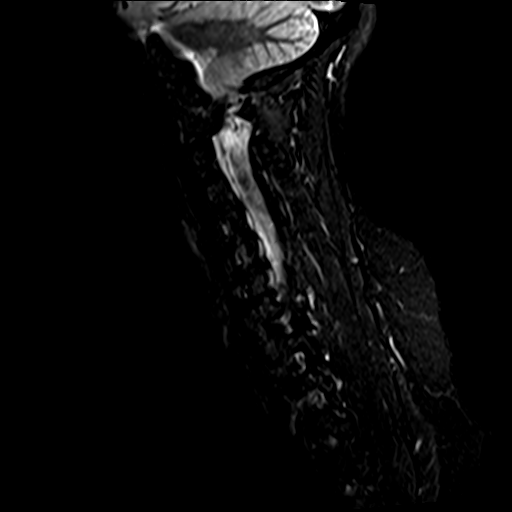
[im 16/19]
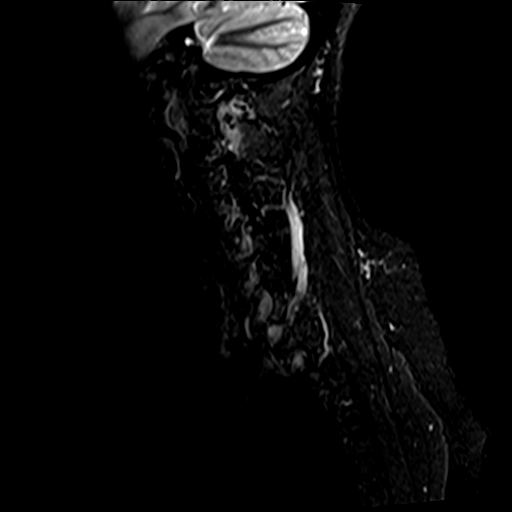

[Series 7: T2 · axial · 3.0mm · 0.70mm/px · z∈[-83,+42]mm · 9 of 35 slices shown (2 of 2)]
[im 1/35]
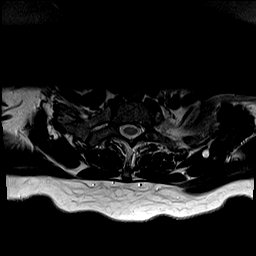
[im 6/35]
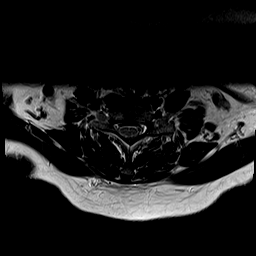
[im 12/35]
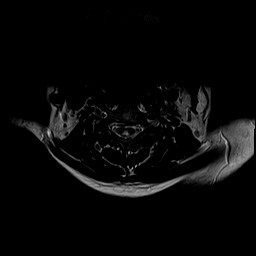
[im 15/35]
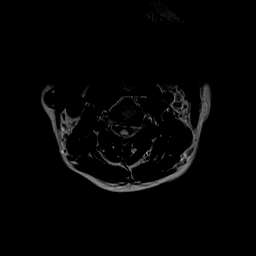
[im 18/35]
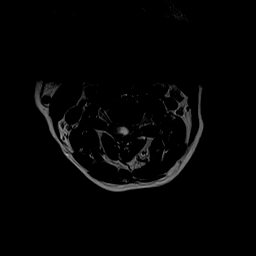
[im 20/35]
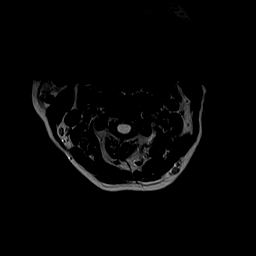
[im 23/35]
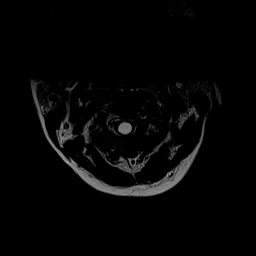
[im 29/35]
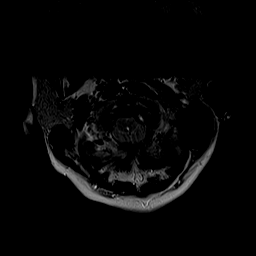
[im 35/35]
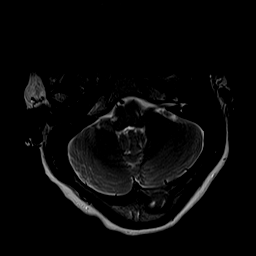

[30 of 48 positions shown; findings below may reference images not displayed]

FINDINGS: Alignment: Mild reversal of the cervical curvature.

Vertebrae: No fracture, evidence of discitis, or bone lesion.

Cord: There is a Chiari 1 malformation with associated syrinx with
cord expansion extending from the the level of the inferior margin
of C1 to the C3-4 level measuring approximately 10 mm in transverse
and 34 mm in craniocaudal dimension. Cord edema is seen extending
into the C5-6 level.

Posterior Fossa, vertebral arteries, paraspinal tissues: Chiari 1
malformation, as described above.

Disc levels:

C2-3: No spinal canal or neural foraminal stenosis.

At C3-4: No spinal canal or neural foraminal stenosis.

C4-5: No spinal canal or neural foraminal stenosis.

C5-6: Uncovertebral degenerative changes resulting in mild right
neural foraminal narrowing. No spinal canal stenosis.

C6-7: Tiny posterior disc protrusion without significant spinal
canal stenosis. Uncovertebral and facet degenerative changes
resulting mild right neural foraminal narrowing.

C7-T1: Uncovertebral and facet degenerative changes resulting in
mild left neural foraminal narrowing.
IMPRESSION: 1. Chiari 1 malformation with associated syrinx extending from the
level of the inferior margin of C1 to the C3-4 level.
2. Mild multilevel degenerative changes of the cervical spine
without high-grade spinal canal or neural foraminal stenosis.

## 2021-09-23 ENCOUNTER — Other Ambulatory Visit (INDEPENDENT_AMBULATORY_CARE_PROVIDER_SITE_OTHER): Payer: Self-pay | Admitting: Primary Care

## 2021-09-23 DIAGNOSIS — Z760 Encounter for issue of repeat prescription: Secondary | ICD-10-CM

## 2021-09-23 NOTE — Telephone Encounter (Signed)
Requested medication (s) are due for refill today: yes  Requested medication (s) are on the active medication list: yes  Last refill:  11/04/20 #270/1RF  Future visit scheduled: no  Notes to clinic:  pt overdue for appt. Pt called, unable to leave VM d/t mailbox full. Please advise.      Requested Prescriptions  Pending Prescriptions Disp Refills   cloNIDine (CATAPRES) 0.1 MG tablet [Pharmacy Med Name: CLONIDINE 0.1MG  TABLETS] 270 tablet 1    Sig: TAKE 1 TABLET BY MOUTH TWICE DAILY, IF CAUSES DROWSINESS TAKE BOTH TABLETS AT BEDTIME     Cardiovascular:  Alpha-2 Agonists Failed - 09/23/2021  8:05 AM      Failed - Valid encounter within last 6 months    Recent Outpatient Visits           10 months ago Colon cancer screening   [facility] Grayce Sessions, NP   1 year ago Cervical cancer screening   [facility] Grayce Sessions, NP   1 year ago Hypertension, unspecified type   [facility] Grayce Sessions, NP   1 year ago Hypertension, unspecified type   [facility] Grayce Sessions, NP   1 year ago Hypertension, unspecified type   [facility] Grayce Sessions, NP              Passed - Last BP in normal range    BP Readings from Last 1 Encounters:  11/04/20 120/76          Passed - Last Heart Rate in normal range    Pulse Readings from Last 1 Encounters:  11/04/20 (!) 107

## 2021-09-23 NOTE — Telephone Encounter (Signed)
Pt called, unable to leave VM d/t mailbox full. Pt is due for appt for BP med refill.

## 2021-09-24 NOTE — Telephone Encounter (Signed)
Sent to PCP ?

## 2021-10-04 ENCOUNTER — Ambulatory Visit (INDEPENDENT_AMBULATORY_CARE_PROVIDER_SITE_OTHER): Payer: Self-pay | Admitting: *Deleted

## 2021-10-04 NOTE — Telephone Encounter (Signed)
° °  Chief Complaint: Elevated BP. Missed 2-3 doses of Clonidine. BP 165/94   Symptoms: Has had diarrhea x 1 week and has headache today. Frequency: Missed doses of medication over the weekend. Pertinent Negatives: Patient denies any other symptoms. Disposition: [] ED /[] Urgent Care (no appt availability in office) / [] Appointment(In office/virtual)/ []  [facility]/ [] Home Care/ [] Refused Recommended Disposition /[] [facility]/ []  Follow-up with PCP Additional Notes: Pt. Requests to be worked in today. "I can't afford UC." Practice currently closed for lunch. Please advise pt.  Answer Assessment - Initial Assessment Questions 1. BLOOD PRESSURE: "What is the blood pressure?" "Did you take at least two measurements 5 minutes apart?"     165/94 2. ONSET: "When did you take your blood pressure?"     This weekend 3. HOW: "How did you obtain the blood pressure?" (e.g., visiting nurse, automatic home BP monitor)     Home cuff Yes 4. HISTORY: "Do you have a history of high blood pressure?"     Missed 2-3 doses 5. MEDICATIONS: "Are you taking any medications for blood pressure?" "Have you missed any doses recently?"     Yes 6. OTHER SYMPTOMS: "Do you have any symptoms?" (e.g., headache, chest pain, blurred vision, difficulty breathing, weakness)     Has had diarrhea, headache 7. PREGNANCY: "Is there any chance you are pregnant?" "When was your last menstrual period?"     No  Protocols used: Blood Pressure - High-A-AH

## 2021-10-04 NOTE — Telephone Encounter (Signed)
Scheduled for an appointment on 10/15/20. She is not taking clonidine as prescribed. States taking 2 daily was making her feel sick.

## 2021-10-04 NOTE — Telephone Encounter (Signed)
Summary: elevated bp 165/94 and diarrhea   Pt called with BP 165/94 and diarrhea.   CB#  [phone]      Called patient to review elevated BP / diarrhea symptoms. No answer, mailbox full at this time unable to leave message.

## 2021-10-15 ENCOUNTER — Encounter (INDEPENDENT_AMBULATORY_CARE_PROVIDER_SITE_OTHER): Payer: Self-pay | Admitting: Primary Care

## 2021-10-15 ENCOUNTER — Ambulatory Visit (INDEPENDENT_AMBULATORY_CARE_PROVIDER_SITE_OTHER): Payer: Self-pay | Admitting: Primary Care

## 2021-10-15 ENCOUNTER — Other Ambulatory Visit: Payer: Self-pay

## 2021-10-15 VITALS — BP 145/86 | HR 85 | Temp 97.7°F | Ht 62.0 in | Wt 204.2 lb

## 2021-10-15 DIAGNOSIS — R5383 Other fatigue: Secondary | ICD-10-CM

## 2021-10-15 DIAGNOSIS — E782 Mixed hyperlipidemia: Secondary | ICD-10-CM

## 2021-10-15 DIAGNOSIS — Z760 Encounter for issue of repeat prescription: Secondary | ICD-10-CM

## 2021-10-15 DIAGNOSIS — I10 Essential (primary) hypertension: Secondary | ICD-10-CM

## 2021-10-15 MED ORDER — CLONIDINE HCL 0.1 MG PO TABS: ORAL_TABLET | ORAL | 1 refills | Status: DC

## 2021-10-15 MED ORDER — PRAVASTATIN SODIUM 20 MG PO TABS: 20.0000 mg | ORAL_TABLET | Freq: Every day | ORAL | 3 refills | Status: DC

## 2021-10-15 NOTE — Progress Notes (Signed)
Pt is fasting and has not taken any medication this morning  °

## 2021-10-16 LAB — CBC WITH DIFFERENTIAL/PLATELET
Basophils Absolute: 0 10*3/uL (ref 0.0–0.2)
Basos: 0 %
EOS (ABSOLUTE): 0.1 10*3/uL (ref 0.0–0.4)
Eos: 2 %
Hematocrit: 37.8 % (ref 34.0–46.6)
Hemoglobin: 12.4 g/dL (ref 11.1–15.9)
Immature Grans (Abs): 0 10*3/uL (ref 0.0–0.1)
Immature Granulocytes: 0 %
Lymphocytes Absolute: 1.8 10*3/uL (ref 0.7–3.1)
Lymphs: 34 %
MCH: 30.3 pg (ref 26.6–33.0)
MCHC: 32.8 g/dL (ref 31.5–35.7)
MCV: 92 fL (ref 79–97)
Monocytes Absolute: 0.6 10*3/uL (ref 0.1–0.9)
Monocytes: 11 %
Neutrophils Absolute: 2.8 10*3/uL (ref 1.4–7.0)
Neutrophils: 53 %
Platelets: 401 10*3/uL (ref 150–450)
RBC: 4.09 x10E6/uL (ref 3.77–5.28)
RDW: 13.5 % (ref 11.7–15.4)
WBC: 5.3 10*3/uL (ref 3.4–10.8)

## 2021-10-16 LAB — CMP14+EGFR
ALT: 7 IU/L (ref 0–32)
AST: 10 IU/L (ref 0–40)
Albumin/Globulin Ratio: 1.6 (ref 1.2–2.2)
Albumin: 4.5 g/dL (ref 3.8–4.9)
Alkaline Phosphatase: 148 IU/L — ABNORMAL HIGH (ref 44–121)
BUN/Creatinine Ratio: 18 (ref 12–28)
BUN: 16 mg/dL (ref 8–27)
Bilirubin Total: 0.4 mg/dL (ref 0.0–1.2)
CO2: 26 mmol/L (ref 20–29)
Calcium: 9.8 mg/dL (ref 8.7–10.3)
Chloride: 101 mmol/L (ref 96–106)
Creatinine, Ser: 0.9 mg/dL (ref 0.57–1.00)
Globulin, Total: 2.9 g/dL (ref 1.5–4.5)
Glucose: 90 mg/dL (ref 70–99)
Potassium: 4.4 mmol/L (ref 3.5–5.2)
Sodium: 141 mmol/L (ref 134–144)
Total Protein: 7.4 g/dL (ref 6.0–8.5)
eGFR: 73 mL/min/{1.73_m2} (ref >59)

## 2021-10-16 LAB — LIPID PANEL
Chol/HDL Ratio: 2.6 ratio (ref 0.0–4.4)
Cholesterol, Total: 191 mg/dL (ref 100–199)
HDL: 73 mg/dL (ref >39)
LDL Chol Calc (NIH): 103 mg/dL — ABNORMAL HIGH (ref 0–99)
Triglycerides: 82 mg/dL (ref 0–149)
VLDL Cholesterol Cal: 15 mg/dL (ref 5–40)

## 2021-10-16 LAB — VITAMIN D 25 HYDROXY (VIT D DEFICIENCY, FRACTURES): Vit D, 25-Hydroxy: 32.6 ng/mL (ref 30.0–100.0)

## 2021-10-21 NOTE — Progress Notes (Signed)
[place], is a 61 y.o. female  [phone]  TDD:[id number]  DOB - 1961/05/10  Chief Complaint  Patient presents with   Hypertension       Subjective:   Allison Pugh is a 61 y.o. female here today for a follow up visit. Patient has No headache, No chest pain, No abdominal pain - No Nausea, No new weakness tingling or numbness, No Cough - SOB.  No problems updated.  ALLERGIES: Allergies  Allergen Reactions   Amlodipine Anaphylaxis    Hives, swelling, blisters   Hydrochlorothiazide     cramps   Losartan Other (See Comments)    Dizziness, headache   Norco [Hydrocodone-Acetaminophen] Swelling    Per patient-lip swelling-01/06/21    PAST MEDICAL HISTORY: History reviewed. No pertinent past medical history.  MEDICATIONS AT HOME: Prior to Admission medications   Medication Sig Start Date End Date Taking? Authorizing Provider  acetaminophen (TYLENOL) 500 MG tablet Take 500 mg by mouth every 6 (six) hours as needed for mild pain.   Yes [provider]  cyclobenzaprine (FLEXERIL) 5 MG tablet Take 1-2 tablets (5-10 mg total) by mouth 3 (three) times daily as needed for muscle spasms. 01/12/21  Yes Leandrew Koyanagi, MD  cloNIDine (CATAPRES) 0.1 MG tablet TAKE 1 TABLET BY MOUTH TWICE DAILY, IF CAUSES DROWSINESS TAKE BOTH TABLETS AT BEDTIME 10/15/21   Kerin Perna, NP  Comprehensive ROS Pertinent positive and negative noted in HPI    Objective:   Vitals:   10/15/21 1005  BP: (!) 145/86  Pulse: 85  Temp: 97.7 F (36.5 C)  TempSrc: Temporal  SpO2: 99%  Weight: 204 lb 3.2 oz (92.6 kg)  Height: _0  (1.575 m)   Exam General appearance : Awake, alert, not in any distress. Speech Clear. Not toxic looking HEENT: Atraumatic and Normocephalic, pupils equally reactive to light and accomodation Neck: Supple, no JVD. No cervical lymphadenopathy.  Chest: Good air entry bilaterally, no added sounds  CVS: S1 S2 regular, no murmurs.   Abdomen: Bowel sounds present, Non tender and not distended with no gaurding, rigidity or rebound. Extremities: B/L Lower Ext shows no edema, both legs are warm to touch Neurology: Awake alert, and oriented X 3, CN II-XII intact, Non focal Skin: No Rash  Data Review Lab Results  Component Value Date   HGBA1C 5.4 02/05/2019    Assessment & Plan   1. Encounter for medication refill - cloNIDine (CATAPRES) 0.1 MG tablet; TAKE 1 TABLET BY MOUTH TWICE DAILY, IF CAUSES DROWSINESS TAKE BOTH TABLETS AT BEDTIME  Dispense: 180 tablet; Refill: 1  2. Mixed hyperlipidemia  Healthy lifestyle diet of fruits vegetables fish nuts whole grains and low saturated fat . Foods high in cholesterol or liver, fatty meats,cheese, butter avocados, nuts and seeds, chocolate and fried foods. - Lipid panel  3. Essential hypertension Counseled on blood pressure goal of less than 130/80, low-sodium, DASH diet, medication compliance, 150 minutes of moderate intensity exercise per week. Discussed medication compliance, adverse effects - CMP14+EGFR  4. Fatigue, unspecified type - CBC with Differential - Vitamin D, 25-hydroxy  Patient have been counseled extensively about nutrition and exercise. Other issues discussed during this visit include: low cholesterol diet, weight control and daily exercise, foot care, annual eye examinations at Ophthalmology, importance of adherence with medications and regular follow-up. We also discussed long term complications of uncontrolled diabetes and hypertension.   Return in about 3 months (around 01/13/2022) for BP.  The patient was given clear  instructions to go to ER or return to medical center if symptoms don't improve, worsen or new problems develop. The patient verbalized understanding. The patient was told to call to get lab results if they haven't heard anything in the next week.   This note has been created with Automotive engineer. Any transcriptional errors are unintentional.   Kerin Perna, NP 10/21/2021, 10:06 AM

## 2021-10-26 ENCOUNTER — Telehealth (INDEPENDENT_AMBULATORY_CARE_PROVIDER_SITE_OTHER): Payer: Self-pay

## 2021-10-26 NOTE — Telephone Encounter (Signed)
-----   Message from Grayce Sessions, NP sent at 10/25/2021 12:22 PM EST ----- Labs are normal.  Make sure you are drinking at least 48 oz of water per day. Work on eating a low fat, heart healthy diet and participate in regular aerobic exercise program to control as well. Exercise at least  30 minutes per day-5 days per week.Elevated slightly LDL try to  Avoid red meat. No fried foods. No junk foods, sodas, sugary foods or drinks, unhealthy snacking, alcohol or smoking.

## 2021-10-26 NOTE — Telephone Encounter (Signed)
Patient is aware of normal lab results as well as diet/exercise advise. She verbalized understanding. Maryjean Morn, CMA

## 2022-01-13 ENCOUNTER — Ambulatory Visit (INDEPENDENT_AMBULATORY_CARE_PROVIDER_SITE_OTHER): Payer: Self-pay | Admitting: Primary Care

## 2022-01-13 ENCOUNTER — Encounter (INDEPENDENT_AMBULATORY_CARE_PROVIDER_SITE_OTHER): Payer: Self-pay | Admitting: Primary Care

## 2022-01-13 VITALS — BP 131/81 | HR 71 | Temp 97.7°F | Ht 62.0 in | Wt 215.2 lb

## 2022-01-13 DIAGNOSIS — Z131 Encounter for screening for diabetes mellitus: Secondary | ICD-10-CM

## 2022-01-13 DIAGNOSIS — Z0130 Encounter for examination of blood pressure without abnormal findings: Secondary | ICD-10-CM

## 2022-01-13 DIAGNOSIS — R635 Abnormal weight gain: Secondary | ICD-10-CM

## 2022-01-13 DIAGNOSIS — K5909 Other constipation: Secondary | ICD-10-CM

## 2022-01-13 LAB — POCT GLYCOSYLATED HEMOGLOBIN (HGB A1C): Hemoglobin A1C: 5.6 % (ref 4.0–5.6)

## 2022-01-13 MED ORDER — BISACODYL 5 MG PO TBEC: 5.0000 mg | DELAYED_RELEASE_TABLET | Freq: Every day | ORAL | 1 refills | Status: DC | PRN

## 2022-01-13 NOTE — Progress Notes (Signed)
?[place] ? ?Allison Pugh, is a 61 y.o. female ? ?[id number] ? ?[id number] ?DOB - 11/30/1960 ? ?Chief Complaint  ?Patient presents with  ? Hypertension  ?    ? ?Subjective:  ? ?Allison Pugh is a 61 y.o. female here today for a follow up visit for Bp check. Patient has No headache, No chest pain, No abdominal pain - No Nausea, No new weakness tingling or numbness, No Cough - shortness of breath ? ?No problems updated. ? ?Allergies  ?Allergen Reactions  ? Amlodipine Anaphylaxis  ?  Hives, swelling, blisters  ? Hydrochlorothiazide   ?  cramps  ? Losartan Other (See Comments)  ?  Dizziness, headache  ? Norco [Hydrocodone-Acetaminophen] Swelling  ?  Per patient-lip swelling-01/06/21  ? ? ?No past medical history on file. ? ?Current Outpatient Medications on File Prior to Visit  ?Medication Sig Dispense Refill  ? acetaminophen (TYLENOL) 500 MG tablet Take 500 mg by mouth every 6 (six) hours as needed for mild pain.    ? cloNIDine (CATAPRES) 0.1 MG tablet TAKE 1 TABLET BY MOUTH TWICE DAILY, IF CAUSES DROWSINESS TAKE BOTH TABLETS AT BEDTIME 180 tablet 1  ? cyclobenzaprine (FLEXERIL) 5 MG tablet Take 1-2 tablets (5-10 mg total) by mouth 3 (three) times daily as needed for muscle spasms. (Patient not taking: Reported on 01/13/2022) 30 tablet 3  ? ?No current facility-administered medications on file prior to visit.  ?Comprehensive ROS Pertinent positive and negative noted in HPI  ? ? ?Objective:  ? ?Vitals:  ? 01/13/22 1031  ?BP: 131/81  ?Pulse: 71  ?Temp: 97.7 ?F (36.5 ?C)  ?TempSrc: Oral  ?SpO2: 98%  ?Weight: 215 lb 3.2 oz (97.6 kg)  ?Height: 5\' 2"  (1.575 m)  ? ? ?Exam ?General appearance : Awake, alert, not in any distress. Speech Clear. Not toxic looking, morbid obese female  ?HEENT: Atraumatic and Normocephalic, pupils equally reactive to light and accomodation ?Neck: Supple, no JVD. No cervical lymphadenopathy.  ?Chest: Good air entry bilaterally, no added sounds  ?CVS: S1 S2 regular, no  murmurs.  ?Abdomen: Bowel sounds present, Non tender and not distended with no gaurding, rigidity or rebound. ?Extremities: B/L Lower Ext shows no edema, both legs are warm to touch ?Neurology: Awake alert, and oriented X 3,  Non focal ?Skin: No Rash ? ?Data Review ?Lab Results  ?Component Value Date  ? HGBA1C 5.6 01/13/2022  ? HGBA1C 5.4 02/05/2019  ? ? ?Assessment & Plan  ?[place] was seen today for hypertension. ? ?Diagnoses and all orders for this visit: ? ?Screening for diabetes mellitus ?-     HgB A1c 5.6 ?Discussed with patient 5.7-6.4 was prediabetes.  She is currently 5.6.  Explained to avoid prediabetes she must monitor her carbohydrates which include foods sweets sugars, rice, potatoes, macaroni and cheese, bread, cookies, candies, soda and needs to lose weight.  From previous visit she has gained 10 to 15 pounds. ? ?Blood pressure check ?Blood pressure check is unremarkable today blood pressure being [id number] no complications or problems voiced with taking medication she continues to be on monotherapy of clonidine 0.1 mg twice a day. ?LDL is slightly elevated explained increased risk for stroke and heart attack blood pressure and needs to be maintaining and monitoring her  cholesterol . Consider eating more fruits, vegetables, and lean baked meats such as chicken or fish. Moderate intensity exercise at least 150 minutes as tolerated per week may help as well.   ?  ?Weight gain ?She recently experienced death in  the family underwent depression and anxiety and found herself eating more and exercising and walking last.  She has been greater than 10 pounds.  Previous visit she was 204 pounds.  Discussed healthy ways of losing weight also placed on AVS. this will also promote blood pressure improvement and decrease risk of prediabetes. ? ?Other constipation ?-     bisacodyl 5 MG EC tablet; Take 1 tablet (5 mg total) by mouth daily as needed for moderate constipation. ? ?  ?Patient have been counseled extensively  about nutrition and exercise. Other issues discussed during this visit include: low cholesterol diet, weight control and daily exercise, foot care, annual eye examinations at Ophthalmology, importance of adherence with medications and regular follow-up. We also discussed long term complications of uncontrolled diabetes and hypertension.  ? ?Return in about 3 months (around 04/14/2022) for HTN, weight gain . ? ?The patient was given clear instructions to go to ER or return to medical center if symptoms don't improve, worsen or new problems develop. The patient verbalized understanding. The patient was told to call to get lab results if they haven't heard anything in the next week.  ? ?This note has been created with Surveyor, quantity. Any transcriptional errors are unintentional.  ? ?Kerin Perna, NP ?01/13/2022, 11:36 AM ? ?

## 2022-01-13 NOTE — Patient Instructions (Signed)
What fruit is best for weight loss? ?Image result ?Grapefruit. Grapefruit is a cross between a pomelo and an orange and is commonly associated with dieting and weight loss. ... ?Apples. Apples are low in calories and high in fiber, with 116 calories and 5.4 g of fiber per large fruit (223 g) ( 12 ). ... ?Berries. ... ?Stone fruits. ... ?Passion fruit. ... ?Rhubarb. ... ?Kiwifruit. ... ?Melons ?AvocadoCalorie Counting for Weight Loss ?Calories are units of energy. Your body needs a certain number of calories from food to keep going throughout the day. When you eat or drink more calories than your body needs, your body stores the extra calories mostly as fat. When you eat or drink fewer calories than your body needs, your body burns fat to get the energy it needs. ?Calorie counting means keeping track of how many calories you eat and drink each day. Calorie counting can be helpful if you need to lose weight. If you eat fewer calories than your body needs, you should lose weight. Ask your health care provider what a healthy weight is for you. ?For calorie counting to work, you will need to eat the right number of calories each day to lose a healthy amount of weight per week. A dietitian can help you figure out how many calories you need in a day and will suggest ways to reach your calorie goal. ?A healthy amount of weight to lose each week is usually 1-2 lb (0.5-0.9 kg). This usually means that your daily calorie intake should be reduced by 500-750 calories. ?Eating 1,200-1,500 calories a day can help most women lose weight. ?Eating 1,500-1,800 calories a day can help most men lose weight. ?What do I need to know about calorie counting? ?Work with your health care provider or dietitian to determine how many calories you should get each day. To meet your daily calorie goal, you will need to: ?Find out how many calories are in each food that you would like to eat. Try to do this before you eat. ?Decide how much of the  food you plan to eat. ?Keep a food log. Do this by writing down what you ate and how many calories it had. ?To successfully lose weight, it is important to balance calorie counting with a healthy lifestyle that includes regular activity. ?Where do I find calorie information? ? ?The number of calories in a food can be found on a Nutrition Facts label. If a food does not have a Nutrition Facts label, try to look up the calories online or ask your dietitian for help. ?Remember that calories are listed per serving. If you choose to have more than one serving of a food, you will have to multiply the calories per serving by the number of servings you plan to eat. For example, the label on a package of bread might say that a serving size is 1 slice and that there are 90 calories in a serving. If you eat 1 slice, you will have eaten 90 calories. If you eat 2 slices, you will have eaten 180 calories. ?How do I keep a food log? ?After each time that you eat, record the following in your food log as soon as possible: ?What you ate. Be sure to include toppings, sauces, and other extras on the food. ?How much you ate. This can be measured in cups, ounces, or number of items. ?How many calories were in each food and drink. ?The total number of calories in the food you ate. ?  Keep your food log near you, such as in a pocket-sized notebook or on an app or website on your mobile phone. Some programs will calculate calories for you and show you how many calories you have left to meet your daily goal. ?What are some portion-control tips? ?Know how many calories are in a serving. This will help you know how many servings you can have of a certain food. ?Use a measuring cup to measure serving sizes. You could also try weighing out portions on a kitchen scale. With time, you will be able to estimate serving sizes for some foods. ?Take time to put servings of different foods on your favorite plates or in your favorite bowls and cups so you  know what a serving looks like. ?Try not to eat straight from a food's packaging, such as from a bag or box. Eating straight from the package makes it hard to see how much you are eating and can lead to overeating. Put the amount you would like to eat in a cup or on a plate to make sure you are eating the right portion. ?Use smaller plates, glasses, and bowls for smaller portions and to prevent overeating. ?Try not to multitask. For example, avoid watching TV or using your computer while eating. If it is time to eat, sit down at a table and enjoy your food. This will help you recognize when you are full. It will also help you be more mindful of what and how much you are eating. ?What are tips for following this plan? ?Reading food labels ?Check the calorie count compared with the serving size. The serving size may be smaller than what you are used to eating. ?Check the source of the calories. Try to choose foods that are high in protein, fiber, and vitamins, and low in saturated fat, trans fat, and sodium. ?Shopping ?Read nutrition labels while you shop. This will help you make healthy decisions about which foods to buy. ?Pay attention to nutrition labels for low-fat or fat-free foods. These foods sometimes have the same number of calories or more calories than the full-fat versions. They also often have added sugar, starch, or salt to make up for flavor that was removed with the fat. ?Make a grocery list of lower-calorie foods and stick to it. ?Cooking ?Try to cook your favorite foods in a healthier way. For example, try baking instead of frying. ?Use low-fat dairy products. ?Meal planning ?Use more fruits and vegetables. One-half of your plate should be fruits and vegetables. ?Include lean proteins, such as chicken, [place], and fish. ?Lifestyle ?Each week, aim to do one of the following: ?150 minutes of moderate exercise, such as walking. ?75 minutes of vigorous exercise, such as running. ?General  information ?Know how many calories are in the foods you eat most often. This will help you calculate calorie counts faster. ?Find a way of tracking calories that works for you. Get creative. Try different apps or programs if writing down calories does not work for you. ?What foods should I eat? ? ?Eat nutritious foods. It is better to have a nutritious, high-calorie food, such as an avocado, than a food with few nutrients, such as a bag of potato chips. ?Use your calories on foods and drinks that will fill you up and will not leave you hungry soon after eating. ?Examples of foods that fill you up are nuts and nut butters, vegetables, lean proteins, and high-fiber foods such as whole grains. High-fiber foods are foods with more  than 5 g of fiber per serving. ?Pay attention to calories in drinks. Low-calorie drinks include water and unsweetened drinks. ?The items listed above may not be a complete list of foods and beverages you can eat. Contact a dietitian for more information. ?What foods should I limit? ?Limit foods or drinks that are not good sources of vitamins, minerals, or protein or that are high in unhealthy fats. These include: ?Candy. ?Other sweets. ?Sodas, specialty coffee drinks, alcohol, and juice. ?The items listed above may not be a complete list of foods and beverages you should avoid. Contact a dietitian for more information. ?How do I count calories when eating out? ?Pay attention to portions. Often, portions are much larger when eating out. Try these tips to keep portions smaller: ?Consider sharing a meal instead of getting your own. ?If you get your own meal, eat only half of it. Before you start eating, ask for a container and put half of your meal into it. ?When available, consider ordering smaller portions from the menu instead of full portions. ?Pay attention to your food and drink choices. Knowing the way food is cooked and what is included with the meal can help you eat fewer calories. ?If  calories are listed on the menu, choose the lower-calorie options. ?Choose dishes that include vegetables, fruits, whole grains, low-fat dairy products, and lean proteins. ?Choose items that are boiled, broiled, grill

## 2022-04-14 ENCOUNTER — Ambulatory Visit (INDEPENDENT_AMBULATORY_CARE_PROVIDER_SITE_OTHER): Payer: Self-pay | Admitting: Primary Care

## 2022-07-20 ENCOUNTER — Ambulatory Visit (INDEPENDENT_AMBULATORY_CARE_PROVIDER_SITE_OTHER): Payer: Self-pay

## 2022-07-22 ENCOUNTER — Ambulatory Visit (INDEPENDENT_AMBULATORY_CARE_PROVIDER_SITE_OTHER): Payer: Self-pay

## 2022-08-03 ENCOUNTER — Ambulatory Visit (INDEPENDENT_AMBULATORY_CARE_PROVIDER_SITE_OTHER): Payer: Self-pay

## 2022-08-03 DIAGNOSIS — Z111 Encounter for screening for respiratory tuberculosis: Secondary | ICD-10-CM

## 2022-08-03 NOTE — Progress Notes (Signed)
Tuberculin skin test applied to right ventral forearm.  Patient informed to schedule appt for nurse visit in 48-72 hours to have site read.  Herbert Deaner, RMA

## 2022-08-05 LAB — TB SKIN TEST
Induration: 0 mm
TB Skin Test: NEGATIVE

## 2022-10-11 ENCOUNTER — Ambulatory Visit (INDEPENDENT_AMBULATORY_CARE_PROVIDER_SITE_OTHER): Payer: Medicaid Other | Admitting: Primary Care

## 2022-10-11 ENCOUNTER — Encounter (INDEPENDENT_AMBULATORY_CARE_PROVIDER_SITE_OTHER): Payer: Self-pay | Admitting: Primary Care

## 2022-10-11 VITALS — BP 144/81 | HR 79 | Resp 16 | Ht 64.5 in | Wt 217.4 lb

## 2022-10-11 DIAGNOSIS — Z1231 Encounter for screening mammogram for malignant neoplasm of breast: Secondary | ICD-10-CM

## 2022-10-11 DIAGNOSIS — R300 Dysuria: Secondary | ICD-10-CM

## 2022-10-11 DIAGNOSIS — K5909 Other constipation: Secondary | ICD-10-CM | POA: Diagnosis not present

## 2022-10-11 DIAGNOSIS — Z1211 Encounter for screening for malignant neoplasm of colon: Secondary | ICD-10-CM | POA: Diagnosis not present

## 2022-10-11 DIAGNOSIS — Z0000 Encounter for general adult medical examination without abnormal findings: Secondary | ICD-10-CM

## 2022-10-11 LAB — POCT URINALYSIS DIP (CLINITEK)
Bilirubin, UA: NEGATIVE
Glucose, UA: NEGATIVE mg/dL
Ketones, POC UA: NEGATIVE mg/dL
Leukocytes, UA: NEGATIVE
Nitrite, UA: NEGATIVE
POC PROTEIN,UA: NEGATIVE
Spec Grav, UA: 1.025 (ref 1.010–1.025)
Urobilinogen, UA: 0.2 E.U./dL
pH, UA: 5.5 (ref 5.0–8.0)

## 2022-10-11 MED ORDER — PHENAZOPYRIDINE HCL 100 MG PO TABS: 100.0000 mg | ORAL_TABLET | Freq: Three times a day (TID) | ORAL | 0 refills | Status: DC | PRN

## 2022-10-11 MED ORDER — LUBIPROSTONE 8 MCG PO CAPS: 8.0000 ug | ORAL_CAPSULE | Freq: Two times a day (BID) | ORAL | 3 refills | Status: DC

## 2022-10-13 NOTE — Progress Notes (Signed)
[facility]  Allison Pugh is a 62 y.o. female presents to office today for annual physical exam examination.    Concerns today include: 1.Acute Concerns: Weight - . Express depression having to move lived in this house greater than 10 years now landlord wants to sell it. She is looking for somewhere to live and family helping her.  Occupation: none, Marital status: single, Substance use: none Diet: regular , Exercise: walking    Health Maintenance  Topic Date Due   COVID-19 Vaccine (1) Never done   COLONOSCOPY (Pts 45-98yrs Insurance coverage will need to be confirmed)  Never done   Zoster Vaccines- Shingrix (1 of 2) Never done   MAMMOGRAM  04/23/2022   INFLUENZA VACCINE  12/25/2022 (Originally 04/26/2022)   PAP SMEAR-Modifier  04/09/2023   DTaP/Tdap/Td (3 - Td or Tdap) 01/18/2027   Hepatitis C Screening  Completed   HIV Screening  Completed   HPV VACCINES  Aged Out     No past medical history on file. Social History   Socioeconomic History   Marital status: Single    Spouse name: Not on file   Number of children: 5   Years of education: college   Highest education level: Not on file  Occupational History   Occupation: CNA  Tobacco Use   Smoking status: Never   Smokeless tobacco: Never  Vaping Use   Vaping Use: Never used  Substance and Sexual Activity   Alcohol use: No   Drug use: Yes    Frequency: 7.0 times per week    Types: Marijuana   Sexual activity: Not on file  Other Topics Concern   Not on file  Social History Narrative   Not on file   Social Determinants of Health   Financial Resource Strain: Not on file  Food Insecurity: Not on file  Transportation Needs: Not on file  Physical Activity: Not on file  Stress: Not on file  Social Connections: Not on file  Intimate Partner Violence: Not on file   Past Surgical History:  Procedure Laterality Date   DILATION AND CURETTAGE OF UTERUS     No family history on file.  Current  Outpatient Medications:    lubiprostone (AMITIZA) 8 MCG capsule, Take 1 capsule (8 mcg total) by mouth 2 (two) times daily with a meal., Disp: 60 capsule, Rfl: 3   phenazopyridine (PYRIDIUM) 100 MG tablet, Take 1 tablet (100 mg total) by mouth 3 (three) times daily as needed for pain., Disp: 10 tablet, Rfl: 0   acetaminophen (TYLENOL) 500 MG tablet, Take 500 mg by mouth every 6 (six) hours as needed for mild pain., Disp: , Rfl:    bisacodyl 5 MG EC tablet, Take 1 tablet (5 mg total) by mouth daily as needed for moderate constipation., Disp: 90 tablet, Rfl: 1   cloNIDine (CATAPRES) 0.1 MG tablet, TAKE 1 TABLET BY MOUTH TWICE DAILY, IF CAUSES DROWSINESS TAKE BOTH TABLETS AT BEDTIME, Disp: 180 tablet, Rfl: 1   cyclobenzaprine (FLEXERIL) 5 MG tablet, Take 1-2 tablets (5-10 mg total) by mouth 3 (three) times daily as needed for muscle spasms. (Patient not taking: Reported on 01/13/2022), Disp: 30 tablet, Rfl: 3 Outpatient Encounter Medications as of 10/11/2022  Medication Sig   lubiprostone (AMITIZA) 8 MCG capsule Take 1 capsule (8 mcg total) by mouth 2 (two) times daily with a meal.   phenazopyridine (PYRIDIUM) 100 MG tablet Take 1 tablet (100 mg total) by mouth 3 (three) times daily as needed for pain.  acetaminophen (TYLENOL) 500 MG tablet Take 500 mg by mouth every 6 (six) hours as needed for mild pain.   bisacodyl 5 MG EC tablet Take 1 tablet (5 mg total) by mouth daily as needed for moderate constipation.   cloNIDine (CATAPRES) 0.1 MG tablet TAKE 1 TABLET BY MOUTH TWICE DAILY, IF CAUSES DROWSINESS TAKE BOTH TABLETS AT BEDTIME   cyclobenzaprine (FLEXERIL) 5 MG tablet Take 1-2 tablets (5-10 mg total) by mouth 3 (three) times daily as needed for muscle spasms. (Patient not taking: Reported on 01/13/2022)   No facility-administered encounter medications on file as of 10/11/2022.    Allergies  Allergen Reactions   Amlodipine Anaphylaxis    Hives, swelling, blisters   Hydrochlorothiazide     cramps    Losartan Other (See Comments)    Dizziness, headache   Norco [Hydrocodone-Acetaminophen] Swelling    Per patient-lip swelling-01/06/21    Health Maintenance: Immunizations -- not utd Colon Cancer Screening -- order  Mammogram -- order   HIV/Hep C Screening completed   No past medical history on file.  Past Surgical History:  Procedure Laterality Date   DILATION AND CURETTAGE OF UTERUS      Current Outpatient Medications on File Prior to Visit  Medication Sig Dispense Refill   acetaminophen (TYLENOL) 500 MG tablet Take 500 mg by mouth every 6 (six) hours as needed for mild pain.     bisacodyl 5 MG EC tablet Take 1 tablet (5 mg total) by mouth daily as needed for moderate constipation. 90 tablet 1   cyclobenzaprine (FLEXERIL) 5 MG tablet Take 1-2 tablets (5-10 mg total) by mouth 3 (three) times daily as needed for muscle spasms. (Patient not taking: Reported on 01/13/2022) 30 tablet 3   No current facility-administered medications on file prior to visit.    Allergies  Allergen Reactions   Amlodipine Anaphylaxis    Hives, swelling, blisters   Hydrochlorothiazide     cramps   Losartan Other (See Comments)    Dizziness, headache   Norco [Hydrocodone-Acetaminophen] Swelling    Per patient-lip swelling-01/06/21    No family history on file.  Social History   Socioeconomic History   Marital status: Single    Spouse name: Not on file   Number of children: 5   Years of education: college   Highest education level: Not on file  Occupational History   Occupation: CNA  Tobacco Use   Smoking status: Never   Smokeless tobacco: Never  Vaping Use   Vaping Use: Never used  Substance and Sexual Activity   Alcohol use: No   Drug use: Yes    Frequency: 7.0 times per week    Types: Marijuana   Sexual activity: Not on file  Other Topics Concern   Not on file  Social History Narrative   Not on file   Social Determinants of Health   Financial Resource Strain: Not on file   Food Insecurity: Not on file  Transportation Needs: Not on file  Physical Activity: Not on file  Stress: Not on file  Social Connections: Not on file  Intimate Partner Violence: Not on file    ROS  Blood Pressure (Abnormal) 144/81   Pulse 79   Respiration 16   Height 5' 4.5" (1.638 m)   Weight 217 lb 6.4 oz (98.6 kg)   Last Menstrual Period 11/25/2013   Oxygen Saturation 95%   Body Mass Index 36.74 kg/m   Physical exam: General: Vital signs reviewed.  Patient is well-developed and well-nourished,  obese female  in no acute distress and cooperative with exam. Head: Normocephalic and atraumatic. Eyes: EOMI, conjunctivae normal, no scleral icterus. Neck: Supple, trachea midline, normal ROM, no JVD, masses, thyromegaly, or carotid bruit present. Cardiovascular: RRR, S1 normal, S2 normal, no murmurs, gallops, or rubs. Pulmonary/Chest: Clear to auscultation bilaterally, no wheezes, rales, or rhonchi. Abdominal: Soft, non-tender, non-distended, BS +, no masses, organomegaly, or guarding present. Musculoskeletal: No joint deformities, erythema, or stiffness, ROM full and nontender. Extremities: No lower extremity edema bilaterally,  pulses symmetric and intact bilaterally. No cyanosis or clubbing. Neurological: A&O x3, Strength is normal Skin: Warm, dry and intact. No rashes or erythema. Psychiatric: Normal mood and affect. speech and behavior is normal. Cognition and memory are normal.    Recent Results (from the past 2160 hour(s))  TB Skin Test     Status: Normal   Collection Time: 08/05/22 11:39 AM  Result Value Ref Range   TB Skin Test Negative    Induration 0 mm  POCT URINALYSIS DIP (CLINITEK)     Status: Abnormal   Collection Time: 10/11/22  3:06 PM  Result Value Ref Range   Color, UA yellow yellow   Clarity, UA clear clear   Glucose, UA negative negative mg/dL   Bilirubin, UA negative negative   Ketones, POC UA negative negative mg/dL   Spec Grav, UA 1.025 1.010 -  1.025   Blood, UA trace-intact (A) negative   pH, UA 5.5 5.0 - 8.0   POC PROTEIN,UA negative negative, trace   Urobilinogen, UA 0.2 0.2 or 1.0 E.U./dL   Nitrite, UA Negative Negative   Leukocytes, UA Negative Negative    Assessment/Plan: Breon was seen today for annual exam.  Diagnoses and all orders for this visit:  Annual physical exam   Dysuria -     POCT URINALYSIS DIP (CLINITEK) -     phenazopyridine (PYRIDIUM) 100 MG tablet; Take 1 tablet (100 mg total) by mouth 3 (three) times daily as needed for pain.  Colon cancer screening -     Ambulatory referral to Gastroenterology  Encounter for screening mammogram for malignant neoplasm of breast -     MM DIGITAL SCREENING BILATERAL; Future  Other constipation 2/2 Other orders -     lubiprostone (AMITIZA) 8 MCG capsule; Take 1 capsule (8 mcg total) by mouth 2 (two) times daily with a meal.   This visit occurred during the SARS-CoV-2 public health emergency.  Safety protocols were in place, including screening questions prior to the visit, additional usage of staff PPE, and extensive cleaning of exam room while observing appropriate contact time as indicated for disinfecting solutions.   Counseled on healthy lifestyle choices, including diet (rich in fruits, vegetables and lean meats and low in salt and simple carbohydrates) and exercise (at least 30 minutes of moderate physical activity daily).  Patient to follow up in 1 year for annual exam or sooner if needed.  The above assessment and management plan was discussed with the patient. The patient verbalized understanding of and has agreed to the management plan. Patient is aware to call the clinic if symptoms persist or worsen. Patient is aware when to return to the clinic for a follow-up visit. Patient educated on when it is appropriate to go to the emergency department.   This note has been created with Surveyor, quantity. Any  transcriptional errors are unintentional.   Kerin Perna, NP 10/13/2022, 10:22 PM

## 2022-10-15 ENCOUNTER — Other Ambulatory Visit (INDEPENDENT_AMBULATORY_CARE_PROVIDER_SITE_OTHER): Payer: Self-pay | Admitting: Primary Care

## 2022-10-15 DIAGNOSIS — Z760 Encounter for issue of repeat prescription: Secondary | ICD-10-CM

## 2022-11-08 ENCOUNTER — Ambulatory Visit
Admission: RE | Admit: 2022-11-08 | Discharge: 2022-11-08 | Disposition: A | Discharge status: Home / Self Care | Payer: Medicaid Other | Source: Ambulatory Visit | Attending: Primary Care | Admitting: Primary Care

## 2022-11-08 DIAGNOSIS — Z1231 Encounter for screening mammogram for malignant neoplasm of breast: Secondary | ICD-10-CM | POA: Diagnosis not present

## 2022-11-22 ENCOUNTER — Telehealth: Payer: Self-pay | Admitting: *Deleted

## 2022-11-22 NOTE — Telephone Encounter (Signed)
Attempted to call x 2 message left with call back # to return call to reschedule pre-visit or upcoming procedure on 12/06/22 will be canceled.

## 2022-11-22 NOTE — Telephone Encounter (Signed)
No return call received,no show letter mailed,pre-visit and procedure canceled.

## 2022-12-06 ENCOUNTER — Encounter: Payer: Medicaid Other | Admitting: Gastroenterology

## 2023-01-10 ENCOUNTER — Ambulatory Visit (INDEPENDENT_AMBULATORY_CARE_PROVIDER_SITE_OTHER): Payer: Medicaid Other | Admitting: Primary Care

## 2023-02-01 ENCOUNTER — Ambulatory Visit (INDEPENDENT_AMBULATORY_CARE_PROVIDER_SITE_OTHER): Payer: Medicaid Other | Admitting: Primary Care

## 2023-02-01 DIAGNOSIS — E782 Mixed hyperlipidemia: Secondary | ICD-10-CM | POA: Diagnosis not present

## 2023-02-01 DIAGNOSIS — Z131 Encounter for screening for diabetes mellitus: Secondary | ICD-10-CM

## 2023-02-01 DIAGNOSIS — I10 Essential (primary) hypertension: Secondary | ICD-10-CM

## 2023-02-02 LAB — LIPID PANEL
Chol/HDL Ratio: 2.4 ratio (ref 0.0–4.4)
Cholesterol, Total: 188 mg/dL (ref 100–199)
HDL: 77 mg/dL (ref >39)
LDL Chol Calc (NIH): 96 mg/dL (ref 0–99)
Triglycerides: 81 mg/dL (ref 0–149)
VLDL Cholesterol Cal: 15 mg/dL (ref 5–40)

## 2023-02-02 LAB — CBC WITH DIFFERENTIAL/PLATELET
Basophils Absolute: 0 10*3/uL (ref 0.0–0.2)
Basos: 0 %
EOS (ABSOLUTE): 0.2 10*3/uL (ref 0.0–0.4)
Eos: 3 %
Hematocrit: 38.6 % (ref 34.0–46.6)
Hemoglobin: 12.2 g/dL (ref 11.1–15.9)
Immature Grans (Abs): 0 10*3/uL (ref 0.0–0.1)
Immature Granulocytes: 0 %
Lymphocytes Absolute: 1.9 10*3/uL (ref 0.7–3.1)
Lymphs: 31 %
MCH: 29.2 pg (ref 26.6–33.0)
MCHC: 31.6 g/dL (ref 31.5–35.7)
MCV: 92 fL (ref 79–97)
Monocytes Absolute: 0.5 10*3/uL (ref 0.1–0.9)
Monocytes: 8 %
Neutrophils Absolute: 3.4 10*3/uL (ref 1.4–7.0)
Neutrophils: 58 %
Platelets: 399 10*3/uL (ref 150–450)
RBC: 4.18 x10E6/uL (ref 3.77–5.28)
RDW: 14 % (ref 11.7–15.4)
WBC: 6 10*3/uL (ref 3.4–10.8)

## 2023-02-02 LAB — HEMOGLOBIN A1C
Est. average glucose Bld gHb Est-mCnc: 126 mg/dL
Hgb A1c MFr Bld: 6 % — ABNORMAL HIGH (ref 4.8–5.6)

## 2023-02-02 LAB — CMP14+EGFR
ALT: 11 IU/L (ref 0–32)
AST: 14 IU/L (ref 0–40)
Albumin/Globulin Ratio: 1.6 (ref 1.2–2.2)
Albumin: 4.2 g/dL (ref 3.9–4.9)
Alkaline Phosphatase: 166 IU/L — ABNORMAL HIGH (ref 44–121)
BUN/Creatinine Ratio: 21 (ref 12–28)
BUN: 15 mg/dL (ref 8–27)
Bilirubin Total: 0.4 mg/dL (ref 0.0–1.2)
CO2: 24 mmol/L (ref 20–29)
Calcium: 9.4 mg/dL (ref 8.7–10.3)
Chloride: 105 mmol/L (ref 96–106)
Creatinine, Ser: 0.7 mg/dL (ref 0.57–1.00)
Globulin, Total: 2.7 g/dL (ref 1.5–4.5)
Glucose: 84 mg/dL (ref 70–99)
Potassium: 4.2 mmol/L (ref 3.5–5.2)
Sodium: 142 mmol/L (ref 134–144)
Total Protein: 6.9 g/dL (ref 6.0–8.5)
eGFR: 98 mL/min/{1.73_m2} (ref >59)

## 2023-02-08 ENCOUNTER — Ambulatory Visit (INDEPENDENT_AMBULATORY_CARE_PROVIDER_SITE_OTHER): Payer: Medicaid Other

## 2023-02-12 NOTE — Progress Notes (Signed)
[facility]   Telephone Note  I connected with Allison Pugh, on 02/12/2023 at 12:25 AM through an audio telephone and verified that I am speaking with the correct person using two identifiers.   Consent: I discussed the limitations, risks, security and privacy concerns of performing an evaluation and management service by telephone and the availability of in person appointments. I also discussed with the patient that there may be a patient responsible charge related to this service. The patient expressed understanding and agreed to proceed.   Location of Patient: Home  Location of Provider: [facility]   Persons participating in Telemedicine visit: LORIELLE ZWIEBEL Allison Passe,  NP   History of Present Illness: [name] is a 62 year old having female having a tele visit to manage HTN, - Patient has No headache, No chest pain, No abdominal pain - No Nausea, No new weakness tingling or numbness, No Cough - shortness of breath. Diabetes -denies polyuria, polydipsia, polyphagia or vision changes.   No past medical history on file. Allergies  Allergen Reactions   Amlodipine Anaphylaxis    Hives, swelling, blisters   Hydrochlorothiazide     cramps   Losartan Other (See Comments)    Dizziness, headache   Norco [Hydrocodone-Acetaminophen] Swelling    Per patient-lip swelling-01/06/21    Current Outpatient Medications on File Prior to Visit  Medication Sig Dispense Refill   acetaminophen (TYLENOL) 500 MG tablet Take 500 mg by mouth every 6 (six) hours as needed for mild pain.     bisacodyl 5 MG EC tablet Take 1 tablet (5 mg total) by mouth daily as needed for moderate constipation. 90 tablet 1   cloNIDine (CATAPRES) 0.1 MG tablet TAKE 1 TABLET BY MOUTH TWICE DAILY. MAY CAUSE DROWSINESS TAKE 2 TABLETS AT BEDTIME 120 tablet 2   cyclobenzaprine (FLEXERIL) 5 MG tablet Take 1-2 tablets (5-10 mg total) by  mouth 3 (three) times daily as needed for muscle spasms. (Patient not taking: Reported on 01/13/2022) 30 tablet 3   lubiprostone (AMITIZA) 8 MCG capsule Take 1 capsule (8 mcg total) by mouth 2 (two) times daily with a meal. 60 capsule 3   phenazopyridine (PYRIDIUM) 100 MG tablet Take 1 tablet (100 mg total) by mouth 3 (three) times daily as needed for pain. 10 tablet 0   No current facility-administered medications on file prior to visit.    Observations/Objective: Last Menstrual Period 11/25/2013    Assessment and Plan: Diagnoses and all orders for this visit:  Screening for diabetes mellitus -     Hemoglobin A1c  Mixed hyperlipidemia -     Lipid panel  Essential hypertension BP goal - < 130/80 Explained that having normal blood pressure is the goal and medications are helping to get to goal and maintain normal blood pressure. DIET: Limit salt intake, read nutrition labels to check salt content, limit fried and high fatty foods  Avoid using multisymptom OTC cold preparations that generally contain sudafed which can rise BP. Consult with pharmacist on best cold relief products to use for persons with HTN EXERCISE Discussed incorporating exercise such as walking - 30 minutes most days of the week and can do in 10 minute intervals    -     CMP14+EGFR     Follow Up Instructions: 3 months   I discussed the assessment and treatment plan with the patient. The patient was provided an opportunity to ask questions and all were answered.  The patient agreed with the plan and demonstrated an understanding of the instructions.   The patient was advised to call back or seek an in-person evaluation if the symptoms worsen or if the condition fails to improve as anticipated.     I provided 20 minutes total of non-face-to-face time during this encounter including median intraservice time, reviewing previous notes, investigations, ordering medications, medical decision making, coordinating care  and patient verbalized understanding at the end of the visit.    This note has been created with Education officer, environmental. Any transcriptional errors are unintentional.   Grayce Sessions, NP 02/12/2023, 12:25 AM

## 2023-06-12 ENCOUNTER — Ambulatory Visit (INDEPENDENT_AMBULATORY_CARE_PROVIDER_SITE_OTHER): Payer: Medicaid Other | Admitting: Primary Care

## 2023-06-12 ENCOUNTER — Encounter (INDEPENDENT_AMBULATORY_CARE_PROVIDER_SITE_OTHER): Payer: Self-pay | Admitting: Primary Care

## 2023-06-12 VITALS — BP 140/82 | HR 80 | Resp 16 | Wt 216.8 lb

## 2023-06-12 DIAGNOSIS — K089 Disorder of teeth and supporting structures, unspecified: Secondary | ICD-10-CM | POA: Diagnosis not present

## 2023-06-12 DIAGNOSIS — M545 Low back pain, unspecified: Secondary | ICD-10-CM

## 2023-06-12 DIAGNOSIS — Z1211 Encounter for screening for malignant neoplasm of colon: Secondary | ICD-10-CM | POA: Diagnosis not present

## 2023-06-12 DIAGNOSIS — I83893 Varicose veins of bilateral lower extremities with other complications: Secondary | ICD-10-CM | POA: Diagnosis not present

## 2023-06-12 MED ORDER — CYCLOBENZAPRINE HCL 10 MG PO TABS
10.0000 mg | ORAL_TABLET | Freq: Three times a day (TID) | ORAL | 1 refills | Status: DC | PRN
Start: 2023-06-12 — End: 2023-07-19

## 2023-06-12 NOTE — Patient Instructions (Signed)

## 2023-06-12 NOTE — Progress Notes (Signed)
[facility]  Subjective: CC: back pain PCP: Grayce Sessions, NP HPI: Patient is a 62 y.o. female presenting to clinic today for back pain. Concerns today include:  1. Back Pain Patient reports that pain began 3 weeks bent over and could not get up..  Yes h/o back pain.  Pain is a 10/10.  It does radiate.  Bending over, lifting and pulling , patients- (occupation CNA) worsens pain.  Muscle spasm improves pain.  Patient has been taking tylenol and ibuprofen  for pain with some relief.  Patient denies trauma or injury.  Denies dysuria, hematuria, fevers, chills, nausea, vomiting, abdominal pain, renal stones. Aggravating factors: certain movements and prolonged walking/standing. Alleviating factors: rest. Progressive LE weakness or saddle anesthesia: none. Extremity sensation changes or weakness: none. Ambulatory without difficulty. Normal bowel/bladder habits: yes; without urinary retention. Normal PO intake without n/v. No associated abdominal pain/n/v. Self treatment: has OTC analgesics, with minimal relief. Patient denies: urinary retention/incontinence, bowel incontinence, weakness, falls, sensation changes or pain anywhere else. No h/o back surgeries.    Current Outpatient Medications:    acetaminophen (TYLENOL) 500 MG tablet, Take 500 mg by mouth every 6 (six) hours as needed for mild pain., Disp: , Rfl:    cloNIDine (CATAPRES) 0.1 MG tablet, TAKE 1 TABLET BY MOUTH TWICE DAILY. MAY CAUSE DROWSINESS TAKE 2 TABLETS AT BEDTIME, Disp: 120 tablet, Rfl: 2   bisacodyl 5 MG EC tablet, Take 1 tablet (5 mg total) by mouth daily as needed for moderate constipation. (Patient not taking: Reported on 06/12/2023), Disp: 90 tablet, Rfl: 1   cyclobenzaprine (FLEXERIL) 5 MG tablet, Take 1-2 tablets (5-10 mg total) by mouth 3 (three) times daily as needed for muscle spasms. (Patient not taking: Reported on 06/12/2023), Disp: 30 tablet, Rfl: 3   lubiprostone (AMITIZA) 8 MCG capsule, Take 1  capsule (8 mcg total) by mouth 2 (two) times daily with a meal. (Patient not taking: Reported on 06/12/2023), Disp: 60 capsule, Rfl: 3   phenazopyridine (PYRIDIUM) 100 MG tablet, Take 1 tablet (100 mg total) by mouth 3 (three) times daily as needed for pain. (Patient not taking: Reported on 06/12/2023), Disp: 10 tablet, Rfl: 0 Allergies  Allergen Reactions   Amlodipine Anaphylaxis    Hives, swelling, blisters   Hydrochlorothiazide     cramps   Losartan Other (See Comments)    Dizziness, headache   Norco [Hydrocodone-Acetaminophen] Swelling    Per patient-lip swelling-01/06/21    No past medical history on file. Social History   Socioeconomic History   Marital status: Single    Spouse name: Not on file   Number of children: 5   Years of education: college   Highest education level: Not on file  Occupational History   Occupation: CNA  Tobacco Use   Smoking status: Never   Smokeless tobacco: Never  Vaping Use   Vaping status: Never Used  Substance and Sexual Activity   Alcohol use: No   Drug use: Yes    Frequency: 7.0 times per week    Types: Marijuana   Sexual activity: Not on file  Other Topics Concern   Not on file  Social History Narrative   Not on file   Social Determinants of Health   Financial Resource Strain: Not on file  Food Insecurity: Not on file  Transportation Needs: Not on file  Physical Activity: Not on file  Stress: Not on file  Social Connections: Not on file  Intimate Partner Violence: Unknown (12/31/2021)   Received  from [facility]   HITS    Physically Hurt: Not on file    Insult or Talk Down To: Not on file    Threaten Physical Harm: Not on file    Scream or Curse: Not on file   Past Surgical History:  Procedure Laterality Date   DILATION AND CURETTAGE OF UTERUS      ROS: per HPI  Objective: Office vital signs reviewed. BP (!) 146/91 (BP Location: Right Arm, Patient Position: Sitting, Cuff Size: Normal)   Pulse 80    Resp 16   Wt 216 lb 12.8 oz (98.3 kg)   LMP 11/25/2013   SpO2 100%   BMI 36.64 kg/m   Physical Examination:  General: Awake, alert, obese nourished, NAD Cardio: Regular rate and rhythm, S1S2 heard, no murmurs appreciated Pulm: Clear to auscultation bilaterally, no wheezes, rhonchi or rales Extremities: Warm, well-perfused. No edema, cyanosis or clubbing; + pulses bilaterally MSK: normal  gait and station  No Curvature  Spine: ROM,  positive midline tenderness to palpation, positive paraspinal tenderness to palpation. No  Palpable bony deformities,  positive straight leg test Neuro: 5/5 lower extremity strength; lower extremity light touch sensation grossly intact, Assessment/ Plan: Allison Pugh was seen today for back pain.  Diagnoses and all orders for this visit:  Acute midline low back pain, unspecified whether sciatica present -     cyclobenzaprine (FLEXERIL) 10 MG tablet; Take 1 tablet (10 mg total) by mouth 3 (three) times daily as needed for muscle spasms.  Poor dentition Pick up information on dentist who accepts her insurance on the way out  Varicose veins of both legs with edema Please keep leg elevated when resting. Using compression hose is highly recommended and should be worn within 1 hour of waking from bed. Avoid sitting for prolonged time. Recliner will not help. Try to lay your legs flat on a sofa or on a bed. Avoid excess salty food. Exercise regularly.  Colon cancer screening -     Cologuard    Please keep leg elevated when resting. Using compression hose is highly recommended and should be worn within 1 hour of waking from bed. Avoid sitting for prolonged time. Recliner will not help. Try to lay your legs flat on a sofa or on a bed. Avoid excess salty food. Exercise regularly. The above assessment and management plan was discussed with the patient. The patient verbalized understanding of and has agreed to the management plan. Patient is aware to call the clinic if  symptoms persist or worsen. Patient is aware when to return to the clinic for a follow-up visit. Patient educated on when it is appropriate to go to the emergency department.   This note has been created with Education officer, environmental. Any transcriptional errors are unintentional.   Grayce Sessions, NP 06/12/2023, 11:11 AM pain

## 2023-06-19 ENCOUNTER — Ambulatory Visit (INDEPENDENT_AMBULATORY_CARE_PROVIDER_SITE_OTHER): Payer: Medicaid Other

## 2023-06-19 VITALS — BP 167/92 | HR 69 | Resp 16

## 2023-06-19 DIAGNOSIS — Z0130 Encounter for examination of blood pressure without abnormal findings: Secondary | ICD-10-CM

## 2023-06-21 ENCOUNTER — Other Ambulatory Visit: Payer: Self-pay

## 2023-06-21 NOTE — Progress Notes (Signed)
Allison Pugh 23-Jul-1961 [id number]  Patient attempted to be outreached by Thomasene Ripple, PharmD Candidate to discuss hypertension.   Thomasene Ripple, Student-PharmD

## 2023-06-26 ENCOUNTER — Ambulatory Visit (INDEPENDENT_AMBULATORY_CARE_PROVIDER_SITE_OTHER): Payer: Medicaid Other

## 2023-06-26 VITALS — BP 191/96

## 2023-06-26 DIAGNOSIS — Z760 Encounter for issue of repeat prescription: Secondary | ICD-10-CM

## 2023-06-26 DIAGNOSIS — Z0130 Encounter for examination of blood pressure without abnormal findings: Secondary | ICD-10-CM

## 2023-06-26 MED ORDER — CLONIDINE HCL 0.1 MG PO TABS
ORAL_TABLET | ORAL | 1 refills | Status: DC
Start: 2023-06-26 — End: 2023-09-22

## 2023-06-28 ENCOUNTER — Ambulatory Visit (INDEPENDENT_AMBULATORY_CARE_PROVIDER_SITE_OTHER): Payer: Medicaid Other

## 2023-06-28 VITALS — BP 130/76

## 2023-06-28 DIAGNOSIS — Z0130 Encounter for examination of blood pressure without abnormal findings: Secondary | ICD-10-CM

## 2023-06-28 NOTE — Progress Notes (Signed)
Pt appt was suppose to be a bp check with nurse

## 2023-07-19 ENCOUNTER — Other Ambulatory Visit: Payer: Self-pay

## 2023-07-19 ENCOUNTER — Ambulatory Visit (INDEPENDENT_AMBULATORY_CARE_PROVIDER_SITE_OTHER): Payer: Medicaid Other | Admitting: Primary Care

## 2023-07-19 ENCOUNTER — Emergency Department (HOSPITAL_BASED_OUTPATIENT_CLINIC_OR_DEPARTMENT_OTHER)
Admission: EM | Admit: 2023-07-19 | Discharge: 2023-07-19 | Disposition: A | Discharge status: Home / Self Care | Payer: Medicaid Other | Attending: Emergency Medicine | Admitting: Emergency Medicine

## 2023-07-19 ENCOUNTER — Encounter (INDEPENDENT_AMBULATORY_CARE_PROVIDER_SITE_OTHER): Payer: Self-pay | Admitting: Primary Care

## 2023-07-19 ENCOUNTER — Encounter (HOSPITAL_BASED_OUTPATIENT_CLINIC_OR_DEPARTMENT_OTHER): Payer: Self-pay

## 2023-07-19 DIAGNOSIS — R519 Headache, unspecified: Secondary | ICD-10-CM | POA: Diagnosis not present

## 2023-07-19 DIAGNOSIS — Y92410 Unspecified street and highway as the place of occurrence of the external cause: Secondary | ICD-10-CM | POA: Insufficient documentation

## 2023-07-19 DIAGNOSIS — M545 Low back pain, unspecified: Secondary | ICD-10-CM | POA: Insufficient documentation

## 2023-07-19 DIAGNOSIS — I10 Essential (primary) hypertension: Secondary | ICD-10-CM | POA: Diagnosis not present

## 2023-07-19 DIAGNOSIS — S43402A Unspecified sprain of left shoulder joint, initial encounter: Secondary | ICD-10-CM | POA: Diagnosis not present

## 2023-07-19 DIAGNOSIS — S4992XA Unspecified injury of left shoulder and upper arm, initial encounter: Secondary | ICD-10-CM | POA: Diagnosis present

## 2023-07-19 DIAGNOSIS — M25552 Pain in left hip: Secondary | ICD-10-CM | POA: Diagnosis not present

## 2023-07-19 HISTORY — DX: Essential (primary) hypertension: I10
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
## 2023-07-21 ENCOUNTER — Other Ambulatory Visit (HOSPITAL_BASED_OUTPATIENT_CLINIC_OR_DEPARTMENT_OTHER): Payer: Self-pay

## 2023-07-21 ENCOUNTER — Other Ambulatory Visit (HOSPITAL_BASED_OUTPATIENT_CLINIC_OR_DEPARTMENT_OTHER): Payer: Self-pay | Admitting: Student

## 2023-07-21 ENCOUNTER — Ambulatory Visit (HOSPITAL_BASED_OUTPATIENT_CLINIC_OR_DEPARTMENT_OTHER)
Admission: RE | Admit: 2023-07-21 | Discharge: 2023-07-21 | Disposition: A | Discharge status: Home / Self Care | Payer: Medicaid Other | Source: Ambulatory Visit | Attending: Student | Admitting: Student

## 2023-07-21 ENCOUNTER — Ambulatory Visit (HOSPITAL_BASED_OUTPATIENT_CLINIC_OR_DEPARTMENT_OTHER): Payer: Medicaid Other | Admitting: Student

## 2023-07-21 ENCOUNTER — Encounter (HOSPITAL_BASED_OUTPATIENT_CLINIC_OR_DEPARTMENT_OTHER): Payer: Self-pay | Admitting: Student

## 2023-07-21 DIAGNOSIS — M25512 Pain in left shoulder: Secondary | ICD-10-CM | POA: Insufficient documentation

## 2023-07-21 DIAGNOSIS — M542 Cervicalgia: Secondary | ICD-10-CM | POA: Diagnosis present

## 2023-07-21 DIAGNOSIS — M25552 Pain in left hip: Secondary | ICD-10-CM

## 2023-07-21 DIAGNOSIS — S46012D Strain of muscle(s) and tendon(s) of the rotator cuff of left shoulder, subsequent encounter: Secondary | ICD-10-CM

## 2023-07-21 DIAGNOSIS — M545 Low back pain, unspecified: Secondary | ICD-10-CM | POA: Diagnosis not present

## 2023-07-21 MED ORDER — METHOCARBAMOL 500 MG PO TABS
500.0000 mg | ORAL_TABLET | Freq: Four times a day (QID) | ORAL | 0 refills | Status: AC | PRN
  Filled 2023-07-21: qty 40, 10d supply, fill #0
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
## 2023-08-04 ENCOUNTER — Telehealth (INDEPENDENT_AMBULATORY_CARE_PROVIDER_SITE_OTHER): Payer: Self-pay

## 2023-08-04 NOTE — Telephone Encounter (Signed)
Pt doesn't need the appt if she doesn't have any concerns. We seen pt on 07/19/23

## 2023-08-04 NOTE — Telephone Encounter (Signed)
Copied from CRM [id number]. Topic: Appointment Scheduling - Scheduling Inquiry for Clinic >> Aug 04, 2023 11:07 AM Everette C wrote: Reason for CRM: The patient would like to know the reason for their upcoming visit on 08/07/23  Please contact the patient further when available

## 2023-08-07 ENCOUNTER — Ambulatory Visit (INDEPENDENT_AMBULATORY_CARE_PROVIDER_SITE_OTHER): Payer: Medicaid Other | Admitting: Primary Care

## 2023-08-07 ENCOUNTER — Telehealth (INDEPENDENT_AMBULATORY_CARE_PROVIDER_SITE_OTHER): Payer: Self-pay | Admitting: Primary Care

## 2023-08-07 NOTE — Telephone Encounter (Signed)
Called pt bc we had they on schedule. Was trying to figure out why the pt was being seen so quickly. Pt is scheduled for 12/4.

## 2023-08-14 ENCOUNTER — Ambulatory Visit (INDEPENDENT_AMBULATORY_CARE_PROVIDER_SITE_OTHER): Payer: Medicaid Other | Admitting: Primary Care

## 2023-08-18 ENCOUNTER — Ambulatory Visit: Payer: Medicaid Other | Attending: Student

## 2023-08-18 VITALS — BP 127/76 | HR 75

## 2023-08-18 DIAGNOSIS — M5459 Other low back pain: Secondary | ICD-10-CM | POA: Insufficient documentation

## 2023-08-18 DIAGNOSIS — M79602 Pain in left arm: Secondary | ICD-10-CM | POA: Diagnosis present

## 2023-08-18 DIAGNOSIS — M542 Cervicalgia: Secondary | ICD-10-CM | POA: Insufficient documentation

## 2023-08-18 DIAGNOSIS — M546 Pain in thoracic spine: Secondary | ICD-10-CM | POA: Diagnosis present

## 2023-08-18 DIAGNOSIS — M545 Low back pain, unspecified: Secondary | ICD-10-CM | POA: Insufficient documentation
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
## 2023-08-21 ENCOUNTER — Ambulatory Visit: Payer: Medicaid Other | Admitting: Physical Therapy

## 2023-08-21 VITALS — BP 141/79 | HR 77

## 2023-08-21 DIAGNOSIS — M5459 Other low back pain: Secondary | ICD-10-CM

## 2023-08-21 DIAGNOSIS — M546 Pain in thoracic spine: Secondary | ICD-10-CM

## 2023-08-21 DIAGNOSIS — M79602 Pain in left arm: Secondary | ICD-10-CM | POA: Diagnosis not present
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
## 2023-08-23 ENCOUNTER — Ambulatory Visit: Payer: Medicaid Other | Admitting: Physical Therapy

## 2023-08-23 ENCOUNTER — Encounter: Payer: Self-pay | Admitting: Physical Therapy

## 2023-08-23 DIAGNOSIS — M79602 Pain in left arm: Secondary | ICD-10-CM

## 2023-08-23 DIAGNOSIS — M546 Pain in thoracic spine: Secondary | ICD-10-CM

## 2023-08-23 DIAGNOSIS — M5459 Other low back pain: Secondary | ICD-10-CM
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
## 2023-08-30 ENCOUNTER — Other Ambulatory Visit (INDEPENDENT_AMBULATORY_CARE_PROVIDER_SITE_OTHER): Payer: Medicaid Other

## 2023-08-31 ENCOUNTER — Ambulatory Visit (HOSPITAL_BASED_OUTPATIENT_CLINIC_OR_DEPARTMENT_OTHER): Payer: Medicaid Other | Admitting: Student

## 2023-09-01 ENCOUNTER — Other Ambulatory Visit (INDEPENDENT_AMBULATORY_CARE_PROVIDER_SITE_OTHER): Payer: Medicaid Other

## 2023-09-01 DIAGNOSIS — I10 Essential (primary) hypertension: Secondary | ICD-10-CM

## 2023-09-01 DIAGNOSIS — E782 Mixed hyperlipidemia: Secondary | ICD-10-CM

## 2023-09-01 DIAGNOSIS — R7303 Prediabetes: Secondary | ICD-10-CM

## 2023-09-02 LAB — CBC WITH DIFFERENTIAL/PLATELET
Basophils Absolute: 0 10*3/uL (ref 0.0–0.2)
Basos: 0 %
EOS (ABSOLUTE): 0.1 10*3/uL (ref 0.0–0.4)
Eos: 3 %
Hematocrit: 36.9 % (ref 34.0–46.6)
Hemoglobin: 11.7 g/dL (ref 11.1–15.9)
Immature Grans (Abs): 0 10*3/uL (ref 0.0–0.1)
Immature Granulocytes: 0 %
Lymphocytes Absolute: 1.9 10*3/uL (ref 0.7–3.1)
Lymphs: 34 %
MCH: 29.2 pg (ref 26.6–33.0)
MCHC: 31.7 g/dL (ref 31.5–35.7)
MCV: 92 fL (ref 79–97)
Monocytes Absolute: 0.5 10*3/uL (ref 0.1–0.9)
Monocytes: 9 %
Neutrophils Absolute: 2.9 10*3/uL (ref 1.4–7.0)
Neutrophils: 54 %
Platelets: 382 10*3/uL (ref 150–450)
RBC: 4.01 x10E6/uL (ref 3.77–5.28)
RDW: 14 % (ref 11.7–15.4)
WBC: 5.4 10*3/uL (ref 3.4–10.8)

## 2023-09-02 LAB — CMP14+EGFR
ALT: 10 [IU]/L (ref 0–32)
AST: 15 [IU]/L (ref 0–40)
Albumin: 4 g/dL (ref 3.9–4.9)
Alkaline Phosphatase: 160 [IU]/L — ABNORMAL HIGH (ref 44–121)
BUN/Creatinine Ratio: 20 (ref 12–28)
BUN: 15 mg/dL (ref 8–27)
Bilirubin Total: 0.3 mg/dL (ref 0.0–1.2)
CO2: 24 mmol/L (ref 20–29)
Calcium: 9.4 mg/dL (ref 8.7–10.3)
Chloride: 103 mmol/L (ref 96–106)
Creatinine, Ser: 0.76 mg/dL (ref 0.57–1.00)
Globulin, Total: 2.7 g/dL (ref 1.5–4.5)
Glucose: 84 mg/dL (ref 70–99)
Potassium: 4.6 mmol/L (ref 3.5–5.2)
Sodium: 140 mmol/L (ref 134–144)
Total Protein: 6.7 g/dL (ref 6.0–8.5)
eGFR: 89 mL/min/{1.73_m2} (ref >59)

## 2023-09-02 LAB — HEMOGLOBIN A1C
Est. average glucose Bld gHb Est-mCnc: 126 mg/dL
Hgb A1c MFr Bld: 6 % — ABNORMAL HIGH (ref 4.8–5.6)

## 2023-09-02 LAB — LIPID PANEL
Chol/HDL Ratio: 2.4 {ratio} (ref 0.0–4.4)
Cholesterol, Total: 196 mg/dL (ref 100–199)
HDL: 81 mg/dL (ref >39)
LDL Chol Calc (NIH): 104 mg/dL — ABNORMAL HIGH (ref 0–99)
Triglycerides: 62 mg/dL (ref 0–149)
VLDL Cholesterol Cal: 11 mg/dL (ref 5–40)

## 2023-09-04 ENCOUNTER — Ambulatory Visit: Payer: Self-pay | Admitting: Physical Therapy

## 2023-09-06 ENCOUNTER — Ambulatory Visit: Payer: Self-pay | Attending: Student

## 2023-09-06 DIAGNOSIS — M79602 Pain in left arm: Secondary | ICD-10-CM | POA: Insufficient documentation

## 2023-09-06 DIAGNOSIS — M546 Pain in thoracic spine: Secondary | ICD-10-CM | POA: Insufficient documentation

## 2023-09-06 DIAGNOSIS — M5459 Other low back pain: Secondary | ICD-10-CM | POA: Insufficient documentation
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
## 2023-09-11 ENCOUNTER — Ambulatory Visit: Payer: Self-pay | Admitting: Physical Therapy

## 2023-09-13 ENCOUNTER — Ambulatory Visit: Payer: Self-pay | Admitting: Physical Therapy

## 2023-09-13 ENCOUNTER — Ambulatory Visit: Payer: Medicaid Other | Admitting: Physical Therapy

## 2023-09-14 ENCOUNTER — Telehealth: Payer: Self-pay | Admitting: Physical Therapy

## 2023-09-15 ENCOUNTER — Ambulatory Visit: Payer: Medicaid Other | Admitting: Physical Therapy

## 2023-09-18 ENCOUNTER — Ambulatory Visit: Payer: Self-pay | Admitting: Physical Therapy

## 2023-09-19 ENCOUNTER — Other Ambulatory Visit (INDEPENDENT_AMBULATORY_CARE_PROVIDER_SITE_OTHER): Payer: Self-pay | Admitting: Primary Care

## 2023-09-19 DIAGNOSIS — Z760 Encounter for issue of repeat prescription: Secondary | ICD-10-CM

## 2023-09-19 NOTE — Telephone Encounter (Signed)
Will forward to provider  

## 2023-09-22 ENCOUNTER — Ambulatory Visit: Payer: Self-pay | Admitting: Physical Therapy

## 2023-09-22 ENCOUNTER — Ambulatory Visit: Payer: Medicaid Other | Admitting: Physical Therapy

## 2023-10-02 ENCOUNTER — Ambulatory Visit (INDEPENDENT_AMBULATORY_CARE_PROVIDER_SITE_OTHER): Payer: Self-pay | Admitting: Primary Care

## 2023-10-02 VITALS — BP 133/77 | HR 74 | Resp 16 | Ht 61.0 in | Wt 220.6 lb

## 2023-10-02 DIAGNOSIS — J029 Acute pharyngitis, unspecified: Secondary | ICD-10-CM

## 2023-10-02 MED ORDER — AMOXICILLIN 875 MG PO TABS: 875.0000 mg | ORAL_TABLET | Freq: Two times a day (BID) | ORAL | 0 refills | Status: AC

## 2023-10-03 ENCOUNTER — Encounter (INDEPENDENT_AMBULATORY_CARE_PROVIDER_SITE_OTHER): Payer: Self-pay | Admitting: Primary Care

## 2023-10-06 NOTE — Progress Notes (Signed)
 [facility]  Allison Pugh, is a 63 y.o. female  RDW:[id number]  FMW:[id number]  DOB - Oct 27, 1960  Chief Complaint  Patient presents with   Sore Throat       Subjective:   Allison Pugh is a 63 y.o. female here today for an acute   visit.  Sore Throat  This is a new problem. The current episode started in the past 7 days. The problem has been gradually worsening. Neither side of throat is experiencing more pain than the other. There has been no fever. The pain is at a severity of 6/10. The pain is moderate. Associated symptoms include coughing. She has had no exposure to strep or mono. She has tried cool liquids for the symptoms. The treatment provided no relief.     No problems updated.  Comprehensive ROS Pertinent positive and negative noted in HPI   Allergies  Allergen Reactions   Amlodipine  Anaphylaxis    Hives, swelling, blisters   Hydrochlorothiazide      cramps   Losartan  Other (See Comments)    Dizziness, headache   Norco [Hydrocodone -Acetaminophen ] Swelling    Per patient-lip swelling-01/06/21    Past Medical History:  Diagnosis Date   Hypertension     Current Outpatient Medications on File Prior to Visit  Medication Sig Dispense Refill   acetaminophen  (TYLENOL ) 500 MG tablet Take 500 mg by mouth every 6 (six) hours as needed for mild pain.     cloNIDine  (CATAPRES ) 0.1 MG tablet TAKE 1 TABLET BY MOUTH IN THE MORNING AND TAKE 2 TABLETS AT NIGHT MAY CUASE DROWSINESS 90 tablet 1   No current facility-administered medications on file prior to visit.   Health Maintenance  Topic Date Due   Colon Cancer Screening  Never done   Pap with HPV screening  04/09/2023   COVID-19 Vaccine (1 - 2024-25 season) Never done   Zoster (Shingles) Vaccine (1 of 2) 10/19/2023*   Flu Shot  12/25/2023*   Mammogram  11/08/2024   DTaP/Tdap/Td vaccine (3 - Td or Tdap) 01/18/2027   Hepatitis C Screening  Completed   HIV Screening  Completed   HPV Vaccine  Aged Out   *Topic was postponed. The date shown is not the original due date.    Objective:   Vitals:   10/02/23 1559 10/03/23 1600  BP: (!) 151/81 133/77  Pulse: 74   Resp: 16   SpO2: 97%   Weight: 220 lb 9.6 oz (100.1 kg)   Height: 5' 1 (1.549 m)    BP Readings from Last 3 Encounters:  10/03/23 133/77  08/21/23 (!) 141/79  08/18/23 127/76      Physical Exam Vitals reviewed.  HENT:     Head: Normocephalic.     Right Ear: Tympanic membrane, ear canal and external ear normal.     Left Ear: Tympanic membrane, ear canal and external ear normal.     Nose: Nose normal.     Mouth/Throat:     Mouth: Mucous membranes are moist.     Pharynx: Pharyngeal swelling and posterior oropharyngeal erythema present.     Tonsils: 2+ on the right. 2+ on the left.  Eyes:     Extraocular Movements: Extraocular movements intact.     Pupils: Pupils are equal, round, and reactive to light.  Cardiovascular:     Rate and Rhythm: Normal rate.  Pulmonary:     Effort: Pulmonary effort is normal.     Breath sounds: Normal breath sounds.  Abdominal:     General:  Bowel sounds are normal.     Palpations: Abdomen is soft.  Musculoskeletal:        General: Normal range of motion.     Cervical back: Normal range of motion.  Skin:    General: Skin is warm and dry.  Neurological:     Mental Status: She is alert and oriented to person, place, and time.  Psychiatric:        Mood and Affect: Mood normal.        Behavior: Behavior normal.        Thought Content: Thought content normal.       Assessment & Plan  Pharyngitis, unspecified etiology  -     Amoxicillin ; Take 1 tablet (875 mg total) by mouth 2 (two) times daily for 10 days.  Dispense: 20 tablet; Refill: 0     Patient have been counseled extensively about nutrition and exercise. Other issues discussed during this visit include: low cholesterol diet, weight control and daily exercise, foot care, annual eye examinations at Ophthalmology,  importance of adherence with medications and regular follow-up. We also discussed long term complications of uncontrolled diabetes and hypertension.  Keep cheduled appt  The patient was given clear instructions to go to ER or return to medical center if symptoms don't improve, worsen or new problems develop. The patient verbalized understanding. The patient was told to call to get lab results if they haven't heard anything in the next week.   This note has been created with Education officer, environmental. Any transcriptional errors are unintentional.   Allison SHAUNNA Bohr, NP 10/06/2023, 8:22 PM

## 2023-12-21 ENCOUNTER — Other Ambulatory Visit (INDEPENDENT_AMBULATORY_CARE_PROVIDER_SITE_OTHER): Payer: Self-pay | Admitting: Primary Care

## 2023-12-21 DIAGNOSIS — Z760 Encounter for issue of repeat prescription: Secondary | ICD-10-CM
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
## 2024-03-13 ENCOUNTER — Telehealth (INDEPENDENT_AMBULATORY_CARE_PROVIDER_SITE_OTHER): Payer: Self-pay | Admitting: Primary Care

## 2024-03-13 DIAGNOSIS — Z760 Encounter for issue of repeat prescription: Secondary | ICD-10-CM

## 2024-03-13 NOTE — Telephone Encounter (Unsigned)
 Copied from CRM [id number]. Topic: Clinical - Medication Refill >> Mar 13, 2024 10:45 AM Sasha H wrote: Medication: cloNIDine  (CATAPRES ) 0.1 MG tablet  Has the patient contacted their pharmacy? Yes (Agent: If no, request that the patient contact the pharmacy for the refill. If patient does not wish to contact the pharmacy document the reason why and proceed with request.) (Agent: If yes, when and what did the pharmacy advise?)  This is the patient's preferred pharmacy:  [facility] [phone] - [place], [place] - [address] AT [facility] [address] [place] [address] Phone: [phone] Fax: [phone]   Is this the correct pharmacy for this prescription? Yes If no, delete pharmacy and type the correct one.   Has the prescription been filled recently? Yes  Is the patient out of the medication? No  Has the patient been seen for an appointment in the last year OR does the patient have an upcoming appointment? Yes  Can we respond through MyChart? No  Agent: Please be advised that Rx refills may take up to 3 business days. We ask that you follow-up with your pharmacy.

## 2024-03-13 NOTE — Telephone Encounter (Signed)
 Called pt to get them scheduled for a TB vaccine. Pt did not answer and VM full. If pt call back, please schedule appt only on Monday, Tuesday or Wednesday.
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
## 2024-03-28 ENCOUNTER — Encounter (INDEPENDENT_AMBULATORY_CARE_PROVIDER_SITE_OTHER): Payer: Self-pay | Admitting: Primary Care

## 2024-04-03 ENCOUNTER — Encounter (INDEPENDENT_AMBULATORY_CARE_PROVIDER_SITE_OTHER): Payer: Self-pay | Admitting: Primary Care

## 2024-04-03 ENCOUNTER — Ambulatory Visit (INDEPENDENT_AMBULATORY_CARE_PROVIDER_SITE_OTHER): Payer: Self-pay | Admitting: Primary Care

## 2024-04-03 VITALS — BP 138/78 | HR 74 | Resp 16 | Ht 65.0 in | Wt 221.0 lb

## 2024-04-03 DIAGNOSIS — Z760 Encounter for issue of repeat prescription: Secondary | ICD-10-CM

## 2024-04-03 DIAGNOSIS — R7303 Prediabetes: Secondary | ICD-10-CM

## 2024-04-03 DIAGNOSIS — Z0000 Encounter for general adult medical examination without abnormal findings: Secondary | ICD-10-CM

## 2024-04-03 DIAGNOSIS — I10 Essential (primary) hypertension: Secondary | ICD-10-CM

## 2024-04-03 DIAGNOSIS — E782 Mixed hyperlipidemia: Secondary | ICD-10-CM

## 2024-04-03 MED ORDER — CLONIDINE HCL 0.1 MG PO TABS: ORAL_TABLET | ORAL | 1 refills | Status: DC

## 2024-04-03 NOTE — Progress Notes (Signed)
 [facility]  Allison Pugh is a 63 y.o. female presents to office today for annual physical exam examination.    Concerns today include: 1. none  OccupationMerchandiser, retail, Marital status: S, Substance use: N Diet: N, Exercise: walk  Health Maintenance  Topic Date Due   Colonoscopy  Never done   Zoster Vaccines- Shingrix (1 of 2) Never done   Cervical Cancer Screening (HPV/Pap Cotest)  04/09/2023   COVID-19 Vaccine (1 - 2024-25 season) Never done   INFLUENZA VACCINE  04/26/2024   MAMMOGRAM  11/08/2024   DTaP/Tdap/Td (3 - Td or Tdap) 01/18/2027   Hepatitis C Screening  Completed   HIV Screening  Completed   Hepatitis B Vaccines  Aged Out   HPV VACCINES  Aged Out   Meningococcal B Vaccine  Aged Out   Past Medical History:  Diagnosis Date   Hypertension    Social History   Socioeconomic History   Marital status: Single    Spouse name: Not on file   Number of children: 5   Years of education: college   Highest education level: Not on file  Occupational History   Occupation: CNA  Tobacco Use   Smoking status: Never   Smokeless tobacco: Never  Vaping Use   Vaping status: Never Used  Substance and Sexual Activity   Alcohol use: No   Drug use: Yes    Frequency: 7.0 times per week    Types: Marijuana   Sexual activity: Not on file  Other Topics Concern   Not on file  Social History Narrative   Not on file   Social Drivers of Health   Financial Resource Strain: Not on file  Food Insecurity: No Food Insecurity (10/02/2023)   Hunger Vital Sign    Worried About Running Out of Food in the Last Year: Never true    Ran Out of Food in the Last Year: Never true  Transportation Needs: No Transportation Needs (10/02/2023)   PRAPARE - Administrator, Civil Service (Medical): No    Lack of Transportation (Non-Medical): No  Physical Activity: Not on file  Stress: Not on file  Social Connections: Not on file  Intimate Partner Violence: Not At Risk  (10/02/2023)   Humiliation, Afraid, Rape, and Kick questionnaire    Fear of Current or Ex-Partner: No    Emotionally Abused: No    Physically Abused: No    Sexually Abused: No   Past Surgical History:  Procedure Laterality Date   DILATION AND CURETTAGE OF UTERUS     Family History  Problem Relation Age of Onset   Breast cancer Neg Hx     Current Outpatient Medications:    acetaminophen  (TYLENOL ) 500 MG tablet, Take 500 mg by mouth every 6 (six) hours as needed for mild pain., Disp: , Rfl:    cloNIDine  (CATAPRES ) 0.1 MG tablet, TAKE 1 TABLET BY MOUTH TWICE DAILY OR IF CAUSES DROWSINESS MAY TAKE 2 TABLETS AT BEDTIME, Disp: 120 tablet, Rfl: 1 Outpatient Encounter Medications as of 04/03/2024  Medication Sig   acetaminophen  (TYLENOL ) 500 MG tablet Take 500 mg by mouth every 6 (six) hours as needed for mild pain.   cloNIDine  (CATAPRES ) 0.1 MG tablet TAKE 1 TABLET BY MOUTH TWICE DAILY OR IF CAUSES DROWSINESS MAY TAKE 2 TABLETS AT BEDTIME   No facility-administered encounter medications on file as of 04/03/2024.   Allergies  Allergen Reactions   Amlodipine  Anaphylaxis    Hives, swelling, blisters   Hydrochlorothiazide   cramps   Losartan  Other (See Comments)    Dizziness, headache   Norco [Hydrocodone -Acetaminophen ] Swelling    Per patient-lip swelling-01/06/21     ROS: Review of Systems Pertinent items noted in HPI and remainder of comprehensive ROS otherwise negative.    Physical exam  General: Vital signs reviewed.  Patient is well-developed and well-nourished, obese in no acute distress and cooperative with exam. Head: Normocephalic and atraumatic. Eyes: EOMI, conjunctivae normal, no scleral icterus. Neck: Supple, trachea midline, normal ROM, no JVD, masses, thyromegaly, or carotid bruit present. Cardiovascular: RRR, S1 normal, S2 normal, no murmurs, gallops, or rubs. Pulmonary/Chest: Clear to auscultation bilaterally, no wheezes, rales, or rhonchi. Abdominal: Soft,  non-tender, non-distended, BS +, no masses, organomegaly, or guarding present. Musculoskeletal: No joint deformities, erythema, or stiffness, ROM full and nontender. Extremities: No lower extremity edema bilaterally,  pulses symmetric and intact bilaterally. No cyanosis or clubbing. Neurological: A&O x3, Strength is normal Skin: Warm, dry and intact. No rashes or erythema. Psychiatric: Normal mood and affect. speech and behavior is normal. Cognition and memory are normal.      Assessment/ Plan: Allison Pugh here for annual physical exam.  Allison Pugh was seen today for annual exam.  Diagnoses and all orders for this visit: Allison Pugh was seen today for annual exam.  Diagnoses and all orders for this visit:  Annual physical exam  Essential hypertension BP goal - < 140/90 DIET: Limit salt intake, read nutrition labels to check salt content, limit fried and high fatty foods  Avoid using multisymptom OTC cold preparations that generally contain sudafed which can rise BP. Consult with pharmacist on best cold relief products to use for persons with HTN EXERCISE Discussed incorporating exercise such as walking - 30 minutes most days of the week and can do in 10 minute intervals    -     CMP14+EGFR -     cloNIDine  (CATAPRES ) 0.1 MG tablet; TAKE 1 TABLET BY MOUTH TWICE DAILY OR IF CAUSES DROWSINESS MAY TAKE 2 TABLETS AT BEDTIME  Mixed hyperlipidemia -     Lipid panel  Prediabetes  monitor carbohydrates -rice, potatoes, tortillas, [place] Corn Masa Flour, breads, pasta, sweets, sodas.  Increase exercising to help maintain appropriate weight.  -     CBC with Differential/Platelet -     Hemoglobin A1c  Encounter for medication refill -     cloNIDine  (CATAPRES ) 0.1 MG tablet; TAKE 1 TABLET BY MOUTH TWICE DAILY OR IF CAUSES DROWSINESS MAY TAKE 2 TABLETS AT BEDTIME     Counseled on healthy lifestyle choices, including diet (rich in fruits, vegetables and lean meats and low in salt and simple  carbohydrates) and exercise (at least 30 minutes of moderate physical activity daily).  Patient to follow up in 1 year for annual exam or sooner if needed.  The above assessment and management plan was discussed with the patient. The patient verbalized understanding of and has agreed to the management plan. Patient is aware to call the clinic if symptoms persist or worsen. Patient is aware when to return to the clinic for a follow-up visit. Patient educated on when it is appropriate to go to the emergency department.   This note has been created with Education officer, environmental. Any transcriptional errors are unintentional.   Rosaline SHAUNNA Bohr, NP 04/03/2024, 3:13 PM

## 2024-04-04 LAB — CBC WITH DIFFERENTIAL/PLATELET
Basophils Absolute: 0 x10E3/uL (ref 0.0–0.2)
Basos: 0 %
EOS (ABSOLUTE): 0.1 x10E3/uL (ref 0.0–0.4)
Eos: 2 %
Hematocrit: 38.4 % (ref 34.0–46.6)
Hemoglobin: 12.2 g/dL (ref 11.1–15.9)
Immature Grans (Abs): 0 x10E3/uL (ref 0.0–0.1)
Immature Granulocytes: 0 %
Lymphocytes Absolute: 2.2 x10E3/uL (ref 0.7–3.1)
Lymphs: 33 %
MCH: 30 pg (ref 26.6–33.0)
MCHC: 31.8 g/dL (ref 31.5–35.7)
MCV: 95 fL (ref 79–97)
Monocytes Absolute: 0.5 x10E3/uL (ref 0.1–0.9)
Monocytes: 8 %
Neutrophils Absolute: 3.8 x10E3/uL (ref 1.4–7.0)
Neutrophils: 57 %
Platelets: 386 x10E3/uL (ref 150–450)
RBC: 4.06 x10E6/uL (ref 3.77–5.28)
RDW: 14.3 % (ref 11.7–15.4)
WBC: 6.8 x10E3/uL (ref 3.4–10.8)

## 2024-04-04 LAB — CMP14+EGFR
ALT: 11 IU/L (ref 0–32)
AST: 18 IU/L (ref 0–40)
Albumin: 4.5 g/dL (ref 3.9–4.9)
Alkaline Phosphatase: 169 IU/L — ABNORMAL HIGH (ref 44–121)
BUN/Creatinine Ratio: 19 (ref 12–28)
BUN: 15 mg/dL (ref 8–27)
Bilirubin Total: 0.3 mg/dL (ref 0.0–1.2)
CO2: 23 mmol/L (ref 20–29)
Calcium: 9.8 mg/dL (ref 8.7–10.3)
Chloride: 104 mmol/L (ref 96–106)
Creatinine, Ser: 0.77 mg/dL (ref 0.57–1.00)
Globulin, Total: 2.8 g/dL (ref 1.5–4.5)
Glucose: 81 mg/dL (ref 70–99)
Potassium: 4.5 mmol/L (ref 3.5–5.2)
Sodium: 142 mmol/L (ref 134–144)
Total Protein: 7.3 g/dL (ref 6.0–8.5)
eGFR: 87 mL/min/1.73 (ref >59)

## 2024-04-04 LAB — LIPID PANEL
Chol/HDL Ratio: 2.7 ratio (ref 0.0–4.4)
Cholesterol, Total: 206 mg/dL — ABNORMAL HIGH (ref 100–199)
HDL: 77 mg/dL (ref >39)
LDL Chol Calc (NIH): 110 mg/dL — ABNORMAL HIGH (ref 0–99)
Triglycerides: 110 mg/dL (ref 0–149)
VLDL Cholesterol Cal: 19 mg/dL (ref 5–40)

## 2024-04-04 LAB — HEMOGLOBIN A1C
Est. average glucose Bld gHb Est-mCnc: 120 mg/dL
Hgb A1c MFr Bld: 5.8 % — ABNORMAL HIGH (ref 4.8–5.6)

## 2024-04-08 ENCOUNTER — Ambulatory Visit: Payer: Self-pay | Admitting: Primary Care

## 2024-04-08 DIAGNOSIS — E782 Mixed hyperlipidemia: Secondary | ICD-10-CM

## 2024-04-08 MED ORDER — PRAVASTATIN SODIUM 40 MG PO TABS: 40.0000 mg | ORAL_TABLET | Freq: Every day | ORAL | 1 refills | Status: AC

## 2024-09-12 ENCOUNTER — Other Ambulatory Visit (INDEPENDENT_AMBULATORY_CARE_PROVIDER_SITE_OTHER): Payer: Self-pay | Admitting: Primary Care

## 2024-09-12 DIAGNOSIS — Z760 Encounter for issue of repeat prescription: Secondary | ICD-10-CM

## 2024-09-12 DIAGNOSIS — I10 Essential (primary) hypertension: Secondary | ICD-10-CM

## 2024-09-13 NOTE — Telephone Encounter (Signed)
 Will forward to provider
